# Patient Record
Sex: Male | Born: 1960 | Race: White | Hispanic: No | Smoking: Former smoker
Health system: Southern US, Community
[De-identification: ages and names within clinical notes are randomized; demographics above are authoritative.]

## PROBLEM LIST (undated history)

## (undated) DIAGNOSIS — K589 Irritable bowel syndrome without diarrhea: Secondary | ICD-10-CM

## (undated) DIAGNOSIS — E785 Hyperlipidemia, unspecified: Secondary | ICD-10-CM

## (undated) DIAGNOSIS — F329 Major depressive disorder, single episode, unspecified: Secondary | ICD-10-CM

## (undated) DIAGNOSIS — D126 Benign neoplasm of colon, unspecified: Secondary | ICD-10-CM

## (undated) DIAGNOSIS — F419 Anxiety disorder, unspecified: Secondary | ICD-10-CM

## (undated) DIAGNOSIS — F32A Depression, unspecified: Secondary | ICD-10-CM

## (undated) HISTORY — DX: Hyperlipidemia, unspecified: E78.5

## (undated) HISTORY — DX: Benign neoplasm of colon, unspecified: D12.6

## (undated) HISTORY — DX: Irritable bowel syndrome without diarrhea: K58.9

## (undated) HISTORY — DX: Anxiety disorder, unspecified: F41.9

---

## 2006-11-04 DIAGNOSIS — D126 Benign neoplasm of colon, unspecified: Secondary | ICD-10-CM

## 2006-11-04 HISTORY — DX: Benign neoplasm of colon, unspecified: D12.6

## 2007-03-02 ENCOUNTER — Ambulatory Visit: Payer: Self-pay | Admitting: Gastroenterology

## 2007-03-13 ENCOUNTER — Ambulatory Visit (HOSPITAL_COMMUNITY): Admission: RE | Admit: 2007-03-13 | Discharge: 2007-03-13 | Payer: Self-pay | Admitting: Gastroenterology

## 2007-03-13 ENCOUNTER — Encounter (INDEPENDENT_AMBULATORY_CARE_PROVIDER_SITE_OTHER): Payer: Self-pay | Admitting: Specialist

## 2007-03-13 ENCOUNTER — Ambulatory Visit: Payer: Self-pay | Admitting: Gastroenterology

## 2007-03-13 HISTORY — PX: ESOPHAGOGASTRODUODENOSCOPY: SHX1529

## 2007-03-13 HISTORY — PX: COLONOSCOPY: SHX174

## 2007-03-23 ENCOUNTER — Ambulatory Visit: Payer: Self-pay | Admitting: Gastroenterology

## 2007-07-13 ENCOUNTER — Ambulatory Visit: Payer: Self-pay | Admitting: Gastroenterology

## 2011-03-19 NOTE — Op Note (Signed)
Zachary Adams, Zachary Adams              ACCOUNT NO.:  [id number]   MEDICAL RECORD NO.:  [id number]          PATIENT TYPE:  AMB   LOCATION:  DAY                           FACILITY:  [facility]   PHYSICIAN:  Kassie Mends, M.D.      DATE OF BIRTH:  [birth year]   DATE OF PROCEDURE:  DATE OF DISCHARGE:                               OPERATIVE REPORT   REFERRING PHYSICIAN:  Donzetta Sprung, MD   PROCEDURES:  1. Colonoscopy with cold forceps biopsy.  2. Esophagogastroduodenoscopy with cold forceps biopsy.   INDICATION FOR EXAM:  [name] is a 50 year old male who presents with  rectal bleeding.  He also has intermittent normal stools with diarrhea.  His colonoscopy is being performed to evaluate his rectal bleeding as  well as his intermittent diarrhea.  He had negative endomysial  antibodies for celiac sprue and the esophagogastroduodenoscopy is being  performed to evaluate for celiac sprue.   FINDINGS:  1. A 5 mm sessile sigmoid colon polyp removed via cold forceps.      Otherwise no masses, inflammatory changes, diverticula,      arteriovenous malformations, or internal hemorrhoids seen.  2. Normal esophagus without evidence of erosion, Barrett's or ulcers.      Normal stomach, duodenal bulb and second portion of the duodenum.      Biopsies obtained to evaluate for celiac sprue.   RECOMMENDATIONS:  1. No source for Mr. Borgen intermittent rectal bleeding was      identified. Will call Mr. Shi with the results of his biopsies.  2. No aspirin, NSAIDs or anticoagulation for 7 days.  3. He should begin high-fiber diet in 7 days.  4. He was given information on a high-fiber diet as well as polyps.  5. Screening colonoscopy in 5 years if polyp is adenomatous and his      children and siblings will need a colonoscopy at age 1 then every      5 years.  6. He has a follow-up appointment to see me within the next 3-4 weeks.      If his biopsies are negative for celiac sprue or microscopic  colitis, then I will consider an empiric course of therapy for      internal hemorrhoids.   MEDICATIONS:  1. Demerol 75 mg IV.  2. Versed 7 mg IV.   PROCEDURE TECHNIQUE:  A physical exam was performed and informed consent  was obtained from the patient after explaining the benefits, risks and  alternatives to the procedure.  The patient was connected to monitor and  placed in the left lateral position.  Continuous oxygen was provided by  nasal cannula and IV medicine administered through an indwelling  cannula.  After administration of sedation and rectal exam, the  patient's rectum was intubated and the scope was advanced under direct  visualization to the cecum.  The scope was subsequently removed slowly  by carefully examining the color, texture, anatomy and integrity of the  mucosa on the way out.   After the colonoscopy, the patient's esophagus was intubated with a  diagnostic gastroscope and the scope was advanced  under direct  visualization to second portion of the duodenum.  The scope was  subsequently removed slowly by carefully examining the color, texture,  anatomy and integrity of the mucosa on the way out.  The patient was  recovered in endoscopy and discharged home in satisfactory condition.      Kassie Mends, M.D.  Electronically Signed     SM/MEDQ  D:  03/13/2007  T:  03/13/2007  Job:  [id number]   cc:   Donzetta Sprung  Fax: [phone]

## 2011-03-19 NOTE — Assessment & Plan Note (Signed)
NAME:  Zachary Adams, Zachary Adams               CHART#:  [id number]   DATE:  07/13/2007                       DOB:  August 27, 1961   REFERRING PHYSICIAN:  Donzetta Sprung, M.D.   PROBLEM LIST:  1. 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  2. Panic attacks.  3. Hyperlipidemia.   SUBJECTIVE:  Zachary Adams is a 50 year old male who presents as a return  patient visit.  He reports the diarrhea stopped on its own.  He is not  taking any Donnatal.  He read the instructions that said that he should  not work out in the sun when using the medication, and so he does not  even remember taking one Donnatal.  He is not having any problems with  abdominal cramps.  His appetite is good.  He does pass gas, but it is  not a problem.   MEDICATIONS:  1. Simvastatin 40 mg daily.  2. Cymbalta 90 mg every morning.  3. Xanax 0.5 mg one-half p.o. q.a.m.   OBJECTIVE:  VITAL SIGNS:  Weight 199 pounds (up 6 pounds since May of  2008), height 6 feet 1 inch.  Temperature 98.2, blood pressure 138/92,  pulse 88.  GENERAL:  He is in no apparent distress.  Alert and oriented x4. LUNGS:  Clear to auscultation bilaterally.CARDIOVASCULAR:  Regular rate and  rhythm with no murmur, normal S1 and S2.ABDOMEN:  Bowel sounds are  present.  Soft, nontender, nondistended.  No rebound or guarding.   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  His symptoms have now resolved.  He also had a history of an adenomatous polyp diagnosed at age 89.  Thank you for allowing me to see Zachary Adams in consultation.  My  recommendations follow.   RECOMMENDATIONS:  1. Screening colonoscopy in 5 years.  His children, brothers and      sisters should have a screening colonoscopy at age 57 and then      every 5 years.  I explained the reason behind this screening      interval and these recommendations to Zachary Adams.  2. He should continue a high  fiber diet.  3. He may return to see me as needed.       Kassie Mends, M.D.  Electronically Signed     SM/MEDQ  D:  07/13/2007  T:  07/13/2007  Job:  [id number]   cc:   Donzetta Sprung

## 2011-03-19 NOTE — Assessment & Plan Note (Signed)
NAME:  EDDI, HYMES               CHART#:  [id number]   DATE:  03/23/2007                       DOB:  Aug 21, 1961   REFERRING PHYSICIAN:  Donzetta Sprung   PROBLEM LIST:  1. 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  2. Panic attack.  3. Hyperlipidemia.   SUBJECTIVE:  The patient is a 50 year old male who was last seen in  April 2008.  He presents as a return patient visit.  He had a  colonoscopy, with cold forceps biopsies and an EGD with cold forceps  biopsies on May 9.  The biopsies of his duodenum and colon showed no  evidence of celiac sprue or microscopic colitis.  He did have an  adenomatous polyp.  He continues to report having a sensation of his  stomach rumbling and tumbling followed by popcorn shrimp diarrhea.  This happens 3-4 times a week plus.  Some days he has normal stool.  Initially he denied any rectal bleeding, however, he may occasionally  see a red streak around solid stool.  He also complains of having bad  gas.  His symptoms are not triggered by any certain foods.  He denies  any significant consumption of mild, cheese or ice cream.  He feels the  digestive advantage in Culturelle he has been trying since February has  made no significant difference.  He believes his symptoms were preceded  by an episode of an intestinal bug in August 2007.  He denies any  burping.  His wife wanted to make sure.  He told me had episodes of bad  breath on occasion.  He has not added any fiber to his diet.   MEDICATIONS:  1. Simvastatin.  2. Cymbalta.  3. Xanax.  4. Over-the-counter digestive Advantage.   OBJECTIVE EXAM:  VITAL SIGNS:  Weight 193 pounds (down 5 pounds since  April 2008).  Height 6' 1.  Temperature 98, blood pressure 130/86,  pulse 66.  GENERAL:  He is in no apparent distress.  Alert and oriented x 4.  LUNGS:  Clear to auscultation, bilaterally.  CARDIOVASCULAR:  Regular rhythm.  No murmur.  Normal S1, S2.  ABDOMEN:  Bowel sounds are present and soft, nontender and nondistended.  No rebound or guarding.   ASSESSMENT:  The patient is a 50 year old male with type 5 stool on the  [facility] stool chart.  He has no evidence of celiac sprue or microscopic  colitis.  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  He did have an adenomatous polyp.  Thank you for allowing me  to see this patient in consultation.  My recommendation follow.   RECOMMENDATIONS:  1. Screening colonoscopy in five years.  His children, brothers and      sisters should have a screening colonoscopy at age 85 and then      every 5 years.  2. He is given a prescription for hyoscyamine 0.375 mg, extended      release tablets.  Instructed to use half to one p.o. twice daily.      I did discuss the side effects, which include dry mouth, dry eyes,      drowsiness and urinary retention.  He is instructed to make sure he      drinks plenty of water  while taking the hyoscyamine .  He is also      instructed to have the prescription filled at [facility] because the      prescriptions will only cost $4 to be filled.  3. I recommended a high fiber diet.  He is given instruction on high      fiber diet and hand out is explained.  4. Return patient visit to see me in two months.  He did have a      question as to whether or not he could have a small bowel lesion.      I did offer him a capsule endoscopy or a small bowel follow      through.  He declined.  I did reassure him that this process most      likely does not involve his small intestines.       Kassie Mends, M.D.  Electronically Signed     SM/MEDQ  D:  03/23/2007  T:  03/23/2007  Job:  [id number]   cc:   Donzetta Sprung

## 2011-03-22 NOTE — Consult Note (Signed)
Zachary Adams, Zachary Adams              ACCOUNT NO.:  [id number]   MEDICAL RECORD NO.:  [id number]          PATIENT TYPE:  AMB   LOCATION:                                FACILITY:  [facility]   PHYSICIAN:  Kassie Mends, M.D.      DATE OF BIRTH:  09-19-1961   DATE OF CONSULTATION:  03/02/2007  DATE OF DISCHARGE:                                 CONSULTATION   REASON FOR CONSULTATION:  Diarrhea since August2007.   HISTORY OF PRESENT ILLNESS:  Zachary Adams is a 50 year old male who has had  diarrhea since August 2007.  His diarrhea is intermittent.  He states  that since summer, late fall he has intermittently had popcorn shrimp  diarrhea.  He had been maintained on imipramine since 1991 for his panic  attacks and he was changed to Cymbalta approximately 12 weeks ago.  He  used to have normal bowel movements.  But over the last 11 weeks he has  been having at least frequently watery diarrhea on the [facility] stool  chart he points out type 7 stool.  He is having episodes of watery  diarrhea at least an average of 4/7 days.  He will have weeks in which  he has 2/7 days and sometimes 0/7 days.  His average though is 4/7 days.  He sometimes has normal days, and sometimes he has no bowel movements.  Sometimes he has watery diarrhea.  He does not remember any  precipitating events.  He does have marital stress.  Occasionally he  sees streaks of blood in his stool.  This happens once a month.  He  denies any rectal pain or rectal itching.  He does not have any rashes.  He denies any sores in his mouth or difficulty swallowing.  He is not  having any heartburn, indigestion or weight loss.  He does drink a six-  pack a day.  He rarely has abdominal pain.  He was having some rectal  urgency with his loose stool.  He denies any drinking out of any streams  or ponds.  He reports having a thyroid checked and it was normal.  He  denies any travel.  He initially thought maybe his loose stool was  related to the  Cymbalta.  He did try some over-the-counter probiotics  and now is on Digestive Advantage but has not noticed any change.  He  had endomysial antibodies checked by Dr. Reuel Boom which were negative in  March 2008.  He has not really lost any weight.  He did submit stool  studies but stated when he submitted those stool studies he was having  formed stool.   PAST MEDICAL HISTORY:  1. Panic attacks.  2. Hyperlipidemia.   PAST SURGICAL HISTORY:  Fingernail surgery.   ALLERGIES:  Penicillin.   MEDICATIONS:  1. Simvastatin 40 mg daily.  2. Cymbalta 30 mg 3 in the morning.  3. Xanax 0.5 mg every 8 hours as needed.  4. Over-the-counter digestive advantage.   FAMILY HISTORY:  He denies family history of colon cancer.  He thinks  his mother may have had  polyps.  He has no family history of ulcer,  colitis or Crohn's disease.  He states his son may have had Crohn's  disease  or may be on the way to having Crohn's disease and is being  treated with MiraLax and Prozac.  He has no family history of breast  cancer, uterine cancer or ovarian cancer.   SOCIAL HISTORY:  He is married and has 2 children.  He is self employed  as a Therapist, occupational.  He does not smoke.   REVIEW OF SYSTEMS:  Per the HPI otherwise all systems are negative.   PHYSICAL EXAM:  VITALS:  Weight 198 pounds, height 6 feet 4 inches, BMI  24.1 (healthy) temperature 98.1, blood pressure 130/82, pulse 76.  GENERAL:  He is in no apparent distress, alert and x4.  HEENT:  Exam is  atraumatic, normocephalic.  Pupils equal, react to light.  Mouth no oral  lesions.  Posterior pharynx without erythema or exudate.  NECK:  Has  full range of motion, no lymphadenopathy.  LUNGS:  Clear to auscultation  bilaterally.  CARDIOVASCULAR:  Shows regular rhythm, no murmur, S1, S2.  ABDOMEN:  Bowel sounds present, soft, nontender, nondistended, no  rebound or guarding.  No hepatosplenomegaly, abdominal bruits or  pulsatile masses.  EXTREMITIES:   Without cyanosis, clubbing or edema.  NEURO:  No focal neurologic deficits.   LABORATORY DATA:  January2008 - BUN 12, creatinine 1.1, potassium 5.0,  albumin to 4.3, total bili 0.5, AST 21, ALT 17, hemoglobin 15.6,  platelets 155, cholesterol 223. Stool studies from February 2008 stool  culture negative for Salmonella, Shigella, Yersinia, Vibrio,  Campylobacter staph aureus or E. coli.  C. Diff toxin negative.   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  He also has rare, intermittent rectal bleeding.  The  differential diagnosis includes for his diarrhea functional gut  disorder, celiac sprue, microscopic colitis and a low likelihood of  villous adenoma.  The differential diagnosis for his rectal bleeding  includes villous adenoma and a garden variety tubular adenoma,  hemorrhoids and a low likelihood of colorectal carcinoma.  Thank you for  allowing me to see Zachary Adams in consultation.  My recommendations  follow.   RECOMMENDATIONS:  1. Will schedule colonoscopy to evaluate his rectal bleeding.  He will      be scheduled for an upper endoscopy to definitively rule out celiac      sprue.  2. Will make additional recommendations for his diarrhea after the      results of his biopsies are known.  3. Has a follow-up appointment to see me in one month.      Kassie Mends, M.D.  Electronically Signed     SM/MEDQ  D:  03/02/2007  T:  03/03/2007  Job:  [id number]   cc:   Donzetta Sprung  Fax: [phone]

## 2012-04-02 ENCOUNTER — Encounter: Payer: Self-pay | Admitting: Gastroenterology

## 2012-12-03 ENCOUNTER — Ambulatory Visit (INDEPENDENT_AMBULATORY_CARE_PROVIDER_SITE_OTHER): Payer: BC Managed Care – PPO | Admitting: Urgent Care

## 2012-12-03 ENCOUNTER — Encounter: Payer: Self-pay | Admitting: Urgent Care

## 2012-12-03 VITALS — BP 150/88 | HR 79 | Temp 98.0°F | Ht 76.0 in | Wt 192.0 lb

## 2012-12-03 DIAGNOSIS — Z86010 Personal history of colonic polyps: Secondary | ICD-10-CM

## 2012-12-03 NOTE — Patient Instructions (Addendum)
Colonoscopy with Dr Darrick Penna

## 2012-12-03 NOTE — Progress Notes (Signed)
Faxed to PCP

## 2012-12-03 NOTE — Assessment & Plan Note (Signed)
Due for surveillance colonoscopy with Dr Darrick Penna.  I have discussed risks & benefits which include, but are not limited to, bleeding, infection, perforation & drug reaction.  The patient agrees with this plan & written consent will be obtained.    Phenergan 25 mg IV 30 min prior to procedure to augment sedation given daily alcohol use

## 2012-12-03 NOTE — Progress Notes (Signed)
Primary Care Physician:  Donzetta Sprung, MD Primary Gastroenterologist:  Dr. Jonette Eva  Chief Complaint  Patient presents with  . Colonoscopy    HPI:  Zachary Adams is a 52 y.o. male here to set up surveillance colonoscopy.  Last colonoscopy by Dr Darrick Penna was in may 2008 where he had a single sigmoid adenomatous colon polyp removed.  He is doing well.   Denies constipation, diarrhea, rectal bleeding, melena or weight loss.   Denies heartburn, indigestion, nausea, vomiting, dysphagia, odynophagia or anorexia.  He does consume a 6-pack of beers daily.  He has done this for past 8 years at least.   Past Medical History  Diagnosis Date  . Anxiety   . IBS (irritable bowel syndrome)   . Adenomatous colon polyp 2008  . Hyperlipemia     Past Surgical History  Procedure Date  . Colonoscopy 03/13/2007      SLF: A 5 mm sessile sigmoid colon adenomatous polyp removed   . Esophagogastroduodenoscopy 03/13/2007      AVW:[id number] esophagus without evidence of erosion, Barrett's or ulcers/ Normal stomach, duodenal bulb and second portion of the duodenum    Current Outpatient Prescriptions  Medication Sig Dispense Refill  . ALPRAZolam (XANAX) 0.5 MG tablet Take 0.5 mg by mouth 3 (three) times daily as needed.       . CYMBALTA 30 MG capsule Take 90 mg by mouth daily.       . simvastatin (ZOCOR) 40 MG tablet Take 40 mg by mouth at bedtime.         Allergies as of 12/03/2012 - Review Complete 12/03/2012  Allergen Reaction Noted  . Penicillins Other (See Comments) 12/03/2012    Family History;There is no known family history of colorectal carcinoma , liver disease, or inflammatory bowel disease.  Problem Relation Age of Onset  . Parkinsonism Father   . Osteoporosis Mother     History   Social History  . Marital Status: Married    Spouse Name: N/A    Number of Children: 2  . Years of Education: N/A   Occupational History  . self employed, horse shoes    Social History Main Topics   . Smoking status: Current Every Day Smoker -- 30 years    Types: Cigars  . Smokeless tobacco: Not on file     Comment: quit cigarettes 2000 (15 yrs), now only 1 cigar per day  . Alcohol Use: Yes     Comment: about a 6 pack each night of beer for 8 yrs  . Drug Use: No  . Sexually Active: Not on file   Other Topics Concern  . Not on file   Social History Narrative   Lives w/ youngest son.  Divorced.    Review of Systems: Gen: Denies any fever, chills, sweats, anorexia, fatigue, weakness, malaise, weight loss, and sleep disorder CV: Denies chest pain, angina, palpitations, syncope, orthopnea, PND, peripheral edema, and claudication. Resp: Denies dyspnea at rest, dyspnea with exercise, cough, sputum, wheezing, coughing up blood, and pleurisy. GI: Denies vomiting blood, jaundice, and fecal incontinence.   Denies dysphagia or odynophagia. GU : Denies urinary burning, blood in urine, urinary frequency, urinary hesitancy, nocturnal urination, and urinary incontinence. MS: Denies joint pain, limitation of movement, and swelling, stiffness, low back pain, extremity pain. Denies muscle weakness, cramps, atrophy.  Derm: Denies rash, itching, dry skin, hives, moles, warts, or unhealing ulcers.  Psych: Denies depression, anxiety, memory loss, suicidal ideation, hallucinations, paranoia, and confusion. Heme: Denies bruising, bleeding, and  enlarged lymph nodes. Neuro:  Denies any headaches, dizziness, paresthesias. Endo:  Denies any problems with DM, thyroid, adrenal function.  Physical Exam: BP 150/88  Pulse 79  Temp 98 F (36.7 C) (Oral)  Ht 6\' 4"  (1.93 m)  Wt 192 lb (87.091 kg)  BMI 23.37 kg/m2 No LMP for male patient. General:   Alert,  Well-developed, well-nourished, pleasant and cooperative in NAD Head:  Normocephalic and atraumatic. Eyes:  Sclera clear, no icterus.   Conjunctiva pink. Ears:  Normal auditory acuity. Nose:  No deformity, discharge, or lesions. Mouth:  No deformity  or lesions,oropharynx pink & moist. Neck:  Supple; no masses or thyromegaly. Lungs:  Clear throughout to auscultation.   No wheezes, crackles, or rhonchi. No acute distress. Heart:  Regular rate and rhythm; no murmurs, clicks, rubs,  or gallops. Abdomen:  Normal bowel sounds.  No bruits.  Soft, non-tender and non-distended without masses, hepatosplenomegaly or hernias noted.  No guarding or rebound tenderness.   Rectal:  Deferred. Msk:  Symmetrical without gross deformities. Normal posture. Pulses:  Normal pulses noted. Extremities:  No clubbing or edema. Neurologic:  Alert and  oriented x4;  grossly normal neurologically. Skin:  Intact without significant lesions or rashes. Lymph Nodes:  No significant cervical adenopathy. Psych:  Alert and cooperative. Normal mood and affect.

## 2012-12-04 ENCOUNTER — Encounter (HOSPITAL_COMMUNITY): Payer: Self-pay | Admitting: Pharmacy Technician

## 2012-12-15 ENCOUNTER — Encounter (HOSPITAL_COMMUNITY)
Admission: RE | Disposition: A | Discharge status: Home / Self Care | Payer: Self-pay | Source: Ambulatory Visit | Attending: Gastroenterology

## 2012-12-15 ENCOUNTER — Ambulatory Visit (HOSPITAL_COMMUNITY)
Admission: RE | Admit: 2012-12-15 | Discharge: 2012-12-15 | Disposition: A | Discharge status: Home / Self Care | Payer: BC Managed Care – PPO | Source: Ambulatory Visit | Attending: Gastroenterology | Admitting: Gastroenterology

## 2012-12-15 ENCOUNTER — Encounter (HOSPITAL_COMMUNITY): Payer: Self-pay | Admitting: *Deleted

## 2012-12-15 DIAGNOSIS — Z860100 Personal history of colon polyps, unspecified: Secondary | ICD-10-CM | POA: Insufficient documentation

## 2012-12-15 DIAGNOSIS — D126 Benign neoplasm of colon, unspecified: Secondary | ICD-10-CM | POA: Insufficient documentation

## 2012-12-15 DIAGNOSIS — K5730 Diverticulosis of large intestine without perforation or abscess without bleeding: Secondary | ICD-10-CM

## 2012-12-15 DIAGNOSIS — Z1211 Encounter for screening for malignant neoplasm of colon: Secondary | ICD-10-CM

## 2012-12-15 DIAGNOSIS — K648 Other hemorrhoids: Secondary | ICD-10-CM | POA: Insufficient documentation

## 2012-12-15 HISTORY — PX: COLONOSCOPY: SHX5424

## 2012-12-15 SURGERY — COLONOSCOPY
Anesthesia: Moderate Sedation

## 2012-12-15 MED ORDER — MIDAZOLAM HCL 5 MG/5ML IJ SOLN
INTRAMUSCULAR | Status: DC | PRN
  Administered 2012-12-15: 2 mg via INTRAVENOUS
  Administered 2012-12-15: 1 mg via INTRAVENOUS
  Administered 2012-12-15 (×2): 2 mg via INTRAVENOUS

## 2012-12-15 MED ORDER — MIDAZOLAM HCL 5 MG/5ML IJ SOLN
INTRAMUSCULAR | Status: AC
  Filled 2012-12-15: qty 10

## 2012-12-15 MED ORDER — PROMETHAZINE HCL 25 MG/ML IJ SOLN
INTRAMUSCULAR | Status: AC
  Filled 2012-12-15: qty 1

## 2012-12-15 MED ORDER — STERILE WATER FOR IRRIGATION IR SOLN
Status: DC | PRN
  Administered 2012-12-15: 11:00:00

## 2012-12-15 MED ORDER — SODIUM CHLORIDE 0.45 % IV SOLN
INTRAVENOUS | Status: DC
  Administered 2012-12-15: 10:00:00 via INTRAVENOUS

## 2012-12-15 MED ORDER — MEPERIDINE HCL 100 MG/ML IJ SOLN
INTRAMUSCULAR | Status: AC
  Filled 2012-12-15: qty 1

## 2012-12-15 MED ORDER — PROMETHAZINE HCL 25 MG/ML IJ SOLN
25.0000 mg | Freq: Once | INTRAMUSCULAR | Status: AC
  Administered 2012-12-15: 25 mg via INTRAVENOUS

## 2012-12-15 MED ORDER — SODIUM CHLORIDE 0.9 % IJ SOLN
INTRAMUSCULAR | Status: AC
  Filled 2012-12-15: qty 10

## 2012-12-15 MED ORDER — MEPERIDINE HCL 100 MG/ML IJ SOLN
INTRAMUSCULAR | Status: DC | PRN
  Administered 2012-12-15 (×3): 25 mg via INTRAVENOUS

## 2012-12-15 NOTE — Op Note (Signed)
Zachary Adams [address] [place], [address]   COLONOSCOPY PROCEDURE REPORT  PATIENT: Zachary Adams, Zachary Adams  MR#: [id number] BIRTHDATE: 1961/08/12 , 51  yrs. old GENDER: Male ENDOSCOPIST: Jonette Eva, MD REFERRED DG:UYQIH Reuel Boom, M.D. PROCEDURE DATE:  12/15/2012 PROCEDURE:   Colonoscopy with biopsy and snare polypectomy INDICATIONS:High risk patient with personal history of colonic polyps. MEDICATIONS: Demerol 75 mg IV and Versed 7 mg IV  PREOP: 25 MG IV  DESCRIPTION OF PROCEDURE:    Physical exam was performed.  Informed consent was obtained from the patient after explaining the benefits, risks, and alternatives to procedure.  The patient was connected to monitor and placed in left lateral position. Continuous oxygen was provided by nasal cannula and IV medicine administered through an indwelling cannula.  After administration of sedation and rectal exam, the patients rectum was intubated and the Pentax Colonoscope [id number]  colonoscope was advanced under direct visualization to the cecum.  The scope was removed slowly by carefully examining the color, texture, anatomy, and integrity mucosa on the way out.  The patient was recovered in endoscopy and discharged home in satisfactory condition.    COLON FINDINGS: Multiple sessile polyps ranging between 3-55mm in size were found in the transverse colon(Butters), descending colon, and sigmoid colon.  A polypectomy was performed using snare cautery and with cold forceps.  , Mild diverticulosis was noted in the sigmoid colon.  , and Moderate sized internal hemorrhoids were found.  PREP QUALITY: good. CECAL W/D TIME: 25 minutes  COMPLICATIONS: None  ENDOSCOPIC IMPRESSION: 1.   12  COLON polyps REMOVED 2.   Mild diverticulosis in the sigmoid colon 3.   Moderate sized internal hemorrhoids  RECOMMENDATIONS: AWAIT BIOPSY HIGH FIBER DIET TCS IN 5 YEARS IF SIMPLE ADENOMAS REMOVED.  TCS IN 10 YEARS IF HYPERPLASTIC POLYPS  REMOVED.       _______________________________ Rosalie DoctorJonette Eva, MD 12/15/2012 12:04 PM     PATIENT NAME:  Zachary, Adams MR#: [id number]

## 2012-12-15 NOTE — H&P (Signed)
  Primary Care Physician:  Donzetta Sprung, MD Primary Gastroenterologist:  Dr. Darrick Penna  Pre-Procedure History & Physical: HPI:  Zachary Adams is a 52 y.o. male here for  PERSONAL HISTORY OF POLYPS.  Past Medical History  Diagnosis Date  . Anxiety   . IBS (irritable bowel syndrome)   . Adenomatous colon polyp 2008  . Hyperlipemia     Past Surgical History  Procedure Laterality Date  . Colonoscopy  03/13/2007      SLF: A 5 mm sessile sigmoid adenomatous polyp removed   . Esophagogastroduodenoscopy  03/13/2007      [id number] esophagus without evidence of erosion, Barrett's or ulcers/ Normal stomach, duodenal bulb and second portion of the duodenum    Prior to Admission medications   Medication Sig Start Date End Date Taking? Authorizing Provider  ALPRAZolam Prudy Feeler) 0.5 MG tablet Take 0.5 mg by mouth 3 (three) times daily as needed. Anxiety. 11/09/12  Yes Historical Provider, MD  CYMBALTA 30 MG capsule Take 90 mg by mouth daily.  09/09/12  Yes Historical Provider, MD  simvastatin (ZOCOR) 40 MG tablet Take 40 mg by mouth at bedtime.  11/19/12  Yes Historical Provider, MD    Allergies as of 12/03/2012 - Review Complete 12/03/2012  Allergen Reaction Noted  . Penicillins Other (See Comments) 12/03/2012    Family History  Problem Relation Age of Onset  . Parkinsonism Father   . Osteoporosis Mother   . Colon cancer Neg Hx     History   Social History  . Marital Status: Divorced    Spouse Name: N/A    Number of Children: 2  . Years of Education: N/A   Occupational History  . self employed, horse shoes    Social History Main Topics  . Smoking status: Current Every Day Smoker -- 2.00 packs/day for 30 years    Types: Cigars  . Smokeless tobacco: Not on file     Comment: quit cigarettes 2000 (15 yrs), now only 1 cigar per day  . Alcohol Use: Yes     Comment: about a 6 pack each night of beer for 8 yrs  . Drug Use: No  . Sexually Active: Not on file   Other Topics Concern   . Not on file   Social History Narrative   Lives w/ youngest son.  Divorced.    Review of Systems: See HPI, otherwise negative ROS   Physical Exam: BP 130/92  Temp(Src) 98.3 F (36.8 C) (Oral)  Resp 18  Ht 6\' 4"  (1.93 m)  Wt 192 lb (87.091 kg)  BMI 23.38 kg/m2  SpO2 95% General:   Alert,  pleasant and cooperative in NAD Head:  Normocephalic and atraumatic. Neck:  Supple; Lungs:  Clear throughout to auscultation.    Heart:  Regular rate and rhythm. Abdomen:  Soft, nontender and nondistended. Normal bowel sounds, without guarding, and without rebound.   Neurologic:  Alert and  oriented x4;  grossly normal neurologically.  Impression/Plan:    PERSONAL HISTORY OF POLYPS.  PLAN: 1. TCS

## 2012-12-18 ENCOUNTER — Encounter (HOSPITAL_COMMUNITY): Payer: Self-pay | Admitting: Gastroenterology

## 2012-12-22 ENCOUNTER — Telehealth: Payer: Self-pay | Admitting: Gastroenterology

## 2012-12-22 NOTE — Telephone Encounter (Signed)
Path faxed to PCP, recall made 

## 2012-12-22 NOTE — Telephone Encounter (Signed)
Please call pt. He had A SERRATED & HYPERPLASTIC POLYPS REMOVED Removed from hIS colon.   FOLLOW A HIGH FIBER DIET. AVOID ITEMS THAT CAUSE BLOATING.   Next colonoscopy in 5 years.

## 2012-12-31 ENCOUNTER — Telehealth: Payer: Self-pay | Admitting: Gastroenterology

## 2012-12-31 NOTE — Telephone Encounter (Signed)
Pt had called for results. I returned his call and LMOM to call.

## 2012-12-31 NOTE — Telephone Encounter (Signed)
LMOM for pt to call. See results note of 12/22/2012.

## 2012-12-31 NOTE — Telephone Encounter (Signed)
Pt had tcs on 2/11 and hasn't heard back regarding his results. Please call him at [phone]

## 2012-12-31 NOTE — Telephone Encounter (Signed)
Pt returning DS call. DS not available. Please call pt back [phone]

## 2012-12-31 NOTE — Telephone Encounter (Signed)
Returned call and LMOM to call.

## 2013-01-01 NOTE — Telephone Encounter (Signed)
Called and informed pt of results and next colonoscopy in 5 years. ( See results of 12/22/2012 note.

## 2013-04-06 NOTE — Progress Notes (Signed)
FEB 2014 SERRATED & HYPERPLASTIC POLYPS-NEXT TCS 2019  REVIEWED.

## 2016-10-07 DIAGNOSIS — Z0001 Encounter for general adult medical examination with abnormal findings: Secondary | ICD-10-CM | POA: Diagnosis not present

## 2017-04-14 DIAGNOSIS — Z0001 Encounter for general adult medical examination with abnormal findings: Secondary | ICD-10-CM | POA: Diagnosis not present

## 2017-04-21 DIAGNOSIS — Z6824 Body mass index (BMI) 24.0-24.9, adult: Secondary | ICD-10-CM | POA: Diagnosis not present

## 2017-04-21 DIAGNOSIS — Z0001 Encounter for general adult medical examination with abnormal findings: Secondary | ICD-10-CM | POA: Diagnosis not present

## 2017-04-21 DIAGNOSIS — Z1212 Encounter for screening for malignant neoplasm of rectum: Secondary | ICD-10-CM | POA: Diagnosis not present

## 2017-11-06 ENCOUNTER — Encounter: Payer: Self-pay | Admitting: Gastroenterology

## 2017-11-10 DIAGNOSIS — F17210 Nicotine dependence, cigarettes, uncomplicated: Secondary | ICD-10-CM | POA: Diagnosis not present

## 2017-11-10 DIAGNOSIS — Z9189 Other specified personal risk factors, not elsewhere classified: Secondary | ICD-10-CM | POA: Diagnosis not present

## 2017-11-10 DIAGNOSIS — Z720 Tobacco use: Secondary | ICD-10-CM | POA: Diagnosis not present

## 2017-11-10 DIAGNOSIS — E782 Mixed hyperlipidemia: Secondary | ICD-10-CM | POA: Diagnosis not present

## 2017-11-17 DIAGNOSIS — F410 Panic disorder [episodic paroxysmal anxiety] without agoraphobia: Secondary | ICD-10-CM | POA: Diagnosis not present

## 2017-11-17 DIAGNOSIS — E782 Mixed hyperlipidemia: Secondary | ICD-10-CM | POA: Diagnosis not present

## 2017-11-17 DIAGNOSIS — F1010 Alcohol abuse, uncomplicated: Secondary | ICD-10-CM | POA: Diagnosis not present

## 2017-11-17 DIAGNOSIS — Z6826 Body mass index (BMI) 26.0-26.9, adult: Secondary | ICD-10-CM | POA: Diagnosis not present

## 2017-11-27 ENCOUNTER — Ambulatory Visit (INDEPENDENT_AMBULATORY_CARE_PROVIDER_SITE_OTHER): Payer: Self-pay

## 2017-11-27 DIAGNOSIS — Z86010 Personal history of colonic polyps: Secondary | ICD-10-CM

## 2017-11-27 NOTE — Progress Notes (Signed)
Gastroenterology Pre-Procedure Review  Request Date:11/27/17 Requesting Physician: 5 year recall slf  PATIENT REVIEW QUESTIONS: The patient responded to the following health history questions as indicated:    1. Diabetes Melitis: no 2. Joint replacements in the past 12 months: no 3. Major health problems in the past 3 months: no 4. Has an artificial valve or MVP: no 5. Has a defibrillator: no 6. Has been advised in past to take antibiotics in advance of a procedure like teeth cleaning: no 7. Family history of colon cancer: yes (yes great uncle)  71. Alcohol Use: yes (daily- beer- 12 ) 9. History of sleep apnea: no  10. History of coronary artery or other vascular stents placed within the last 12 months: no 11. History of any prior anesthesia complications: no    MEDICATIONS & ALLERGIES:    Patient reports the following regarding taking any blood thinners:   Plavix? no Aspirin? no Coumadin? no Brilinta? no Xarelto? no Eliquis? no Pradaxa? no Savaysa? no Effient? no  Patient confirms/reports the following medications:  Current Outpatient Medications  Medication Sig Dispense Refill  . ALPRAZolam (XANAX) 0.5 MG tablet Take 0.5 mg by mouth 3 (three) times daily as needed. Anxiety.    . Ascorbic Acid (VITAMIN C) 1000 MG tablet Take 1,000 mg by mouth daily.    . CYMBALTA 30 MG capsule Take 90 mg by mouth daily.     Marland Kitchen loratadine (CLARITIN) 10 MG tablet Take 10 mg by mouth daily.    . Multiple Vitamin (MULTIVITAMIN) tablet Take 1 tablet by mouth daily.    . simvastatin (ZOCOR) 40 MG tablet Take 40 mg by mouth at bedtime.     . triamcinolone cream (KENALOG) 0.1 % APPLY TO AFFECTED AREA 3 TIMES A DAY  1   No current facility-administered medications for this visit.     Patient confirms/reports the following allergies:  Allergies  Allergen Reactions  . Penicillins Other (See Comments)    Does not know    No orders of the defined types were placed in this  encounter.   AUTHORIZATION INFORMATION Primary Insurance:   ID #:   Group #:  Pre-Cert / Auth required:  Pre-Cert / Auth #:    SCHEDULE INFORMATION: Procedure has been scheduled as follows:  Date: , Time: Location:   This Gastroenterology Pre-Precedure Review Form is being routed to the following provider(s): Walden Field NP

## 2017-11-27 NOTE — Progress Notes (Signed)
Patient will need OV for augmented sedation due to 12 beers daily and sedating medications.

## 2017-11-27 NOTE — Progress Notes (Signed)
Pt came in for nurse visit. He is currently taking xanax tid and cymbalta 90mg  a day and drinking 12 beers a day. Spoke with EG. Advised pt he would need to be done in the OR and he would need to come in for an ov. He stated he understood. He is scheduled with AB on 12/10/17.

## 2017-12-10 ENCOUNTER — Encounter: Payer: Self-pay | Admitting: Gastroenterology

## 2017-12-10 ENCOUNTER — Other Ambulatory Visit: Payer: Self-pay

## 2017-12-10 ENCOUNTER — Ambulatory Visit: Payer: BLUE CROSS/BLUE SHIELD | Admitting: Gastroenterology

## 2017-12-10 VITALS — BP 147/93 | HR 105 | Temp 99.2°F | Ht 76.0 in | Wt 210.4 lb

## 2017-12-10 DIAGNOSIS — Z86010 Personal history of colonic polyps: Secondary | ICD-10-CM

## 2017-12-10 MED ORDER — PEG 3350-KCL-NA BICARB-NACL 420 G PO SOLR: 4000.0000 mL | ORAL | 0 refills | Status: DC

## 2017-12-10 NOTE — Progress Notes (Signed)
CC'D TO PCP °

## 2017-12-10 NOTE — Progress Notes (Addendum)
REVIEWED-NO ADDITIONAL RECOMMENDATIONS.  Primary Care Physician:  Caryl Bis, MD Primary Gastroenterologist:  Dr. Oneida Alar   Chief Complaint  Patient presents with  . Colonoscopy    consult    HPI:   Zachary Adams is a 57 y.o. male presenting today to schedule surveillance colonoscopy due to history of adenomatous polyps. Last was in 2014 with multiple sessile polyps. He denies any abdominal pain, N/V, rectal bleeding, changes in bowel habits. He drinks about 12 beers each evening. He states his PCP does blood work and his labs are normal. Denies abdominal distension, lower extremity edema, chest pain, shortness of breath, muscle aches and pains. He states he has chronic anxiety.   Past Medical History:  Diagnosis Date  . Adenomatous colon polyp 2008  . Anxiety   . Hyperlipemia   . IBS (irritable bowel syndrome)     Past Surgical History:  Procedure Laterality Date  . COLONOSCOPY  03/13/2007     SLF: A 5 mm sessile sigmoid adenomatous polyp removed   . COLONOSCOPY N/A 12/15/2012   Dr. Oneida Alar: multiple sessile polyps (12), mild diverticulosis in sigmoid colon, moderate internal hemorrhoids. path with serrated adenoma and hyperplastic polyps.   . ESOPHAGOGASTRODUODENOSCOPY  03/13/2007     [id number] esophagus without evidence of erosion, Barrett's or ulcers/ Normal stomach, duodenal bulb and second portion of the duodenum    Current Outpatient Medications  Medication Sig Dispense Refill  . ALPRAZolam (XANAX) 0.5 MG tablet Take 0.5 mg by mouth 3 (three) times daily as needed. Anxiety.    . Ascorbic Acid (VITAMIN C) 1000 MG tablet Take 1,000 mg by mouth daily.    . CYMBALTA 30 MG capsule Take 30 mg by mouth daily.     Marland Kitchen loratadine (CLARITIN) 10 MG tablet Take 10 mg by mouth daily.    . Multiple Vitamin (MULTIVITAMIN) tablet Take 1 tablet by mouth daily.    . simvastatin (ZOCOR) 40 MG tablet Take 40 mg by mouth at bedtime.     . triamcinolone cream (KENALOG) 0.1 % APPLY  TO AFFECTED AREA 3 TIMES A DAY  1   No current facility-administered medications for this visit.     Allergies as of 12/10/2017 - Review Complete 12/10/2017  Allergen Reaction Noted  . Penicillins Other (See Comments) 12/03/2012    Family History  Problem Relation Age of Onset  . Parkinsonism Father   . Osteoporosis Mother   . Colon cancer Neg Hx     Social History   Socioeconomic History  . Marital status: Divorced    Spouse name: Not on file  . Number of children: 2  . Years of education: Not on file  . Highest education level: Not on file  Social Needs  . Financial resource strain: Not on file  . Food insecurity - worry: Not on file  . Food insecurity - inability: Not on file  . Transportation needs - medical: Not on file  . Transportation needs - non-medical: Not on file  Occupational History  . Occupation: self employed, horse shoes  Tobacco Use  . Smoking status: Former Smoker    Packs/day: 2.00    Years: 30.00    Pack years: 60.00    Types: Cigars    Last attempt to quit: 03/19/2017    Years since quitting: 0.7  . Smokeless tobacco: Never Used  . Tobacco comment: quit cigarettes 2000, quit cigar May 2018   Substance and Sexual Activity  . Alcohol use: Yes  Comment: about a 12 pack each night of beer chronically   . Drug use: No  . Sexual activity: Not on file  Other Topics Concern  . Not on file  Social History Narrative   Lives w/ youngest son.  Divorced.    Review of Systems: Gen: Denies any fever, chills, fatigue, weight loss, lack of appetite.  CV: Denies chest pain, heart palpitations, peripheral edema, syncope.  Resp: Denies shortness of breath at rest or with exertion. Denies wheezing or cough.  GI: Denies dysphagia or odynophagia. Denies jaundice, hematemesis, fecal incontinence. GU : Denies urinary burning, urinary frequency, urinary hesitancy MS: Denies joint pain, muscle weakness, cramps, or limitation of movement.  Derm: Denies rash,  itching, dry skin Psych: Denies depression, anxiety, memory loss, and confusion Heme: Denies bruising, bleeding, and enlarged lymph nodes.  Physical Exam: BP (!) 147/93   Pulse (!) 105   Temp 99.2 F (37.3 C) (Oral)   Ht 6\' 4"  (1.93 m)   Wt 210 lb 6.4 oz (95.4 kg)   BMI 25.61 kg/m  General:   Alert and oriented. Pleasant and cooperative. Well-nourished and well-developed.  Head:  Normocephalic and atraumatic. Eyes:  Without icterus, sclera clear and conjunctiva pink.  Ears:  Normal auditory acuity. Nose:  No deformity, discharge,  or lesions. Mouth:  No deformity or lesions, oral mucosa pink.  Lungs:  Clear to auscultation bilaterally. No wheezes, rales, or rhonchi. No distress.  Heart:  S1, S2 present without murmurs appreciated.  Abdomen:  +BS, soft, non-tender and non-distended. No HSM noted. No guarding or rebound. No masses appreciated.  Rectal:  Deferred  Msk:  Symmetrical without gross deformities. Normal posture. Extremities:  Without  edema. Neurologic:  Alert and  oriented x4;  grossly normal neurologically. Skin:  Intact without significant lesions or rashes. Cervical Nodes:  No significant cervical adenopathy. Psych:  Alert and cooperative. Normal mood and affect.

## 2017-12-10 NOTE — Patient Instructions (Signed)
We have scheduled you for a colonoscopy with Dr. Oneida Alar in the near future.  It was good to meet you!

## 2017-12-10 NOTE — Assessment & Plan Note (Signed)
56 year old male with history of adenomatous polyps (multiple polyps removed in 2014), with need for surveillance colonoscopy. No concerning lower or upper GI signs/symptoms. Due to polypharmacy and ETOH abuse, will need Propofol. As of note, he drinks 12 beers a day. We had an extensive discussion regarding alcohol use and liver disease. He states he drinks because it helps his anxiety. I encouraged him to speak with Dr. Quillian Quince regarding possible programs for him, as he desires to quit.   Proceed with colonoscopy with Dr. Oneida Alar in the near future. The risks, benefits, and alternatives have been discussed in detail with the patient. They state understanding and desire to proceed.  Propofol due to polypharmacy and ETOH abuse

## 2017-12-11 ENCOUNTER — Telehealth: Payer: Self-pay

## 2017-12-11 NOTE — Telephone Encounter (Signed)
Tried to call pt to inform of pre-op appt 01/06/18 at 9:00am, no answer, LMOAM. Letter mailed.

## 2018-01-02 NOTE — Patient Instructions (Signed)
Zachary Adams  01/02/2018     @PREFPERIOPPHARMACY @   Your procedure is scheduled on 01/13/2018  Report to [facility] at 615 A.M.  Call this number if you have problems the morning of surgery:  [phone]   Remember:  Do not eat food or drink liquids after midnight.  Take these medicines the morning of surgery with A SIP OF WATER Xanax, Cymbalta, Claritin   Do not wear jewelry, make-up or nail polish.  Do not wear lotions, powders, or perfumes, or deodorant.  Do not shave 48 hours prior to surgery.  Men may shave face and neck.  Do not bring valuables to the hospital.  [facility] is not responsible for any belongings or valuables.  Contacts, dentures or bridgework may not be worn into surgery.  Leave your suitcase in the car.  After surgery it may be brought to your room.  For patients admitted to the hospital, discharge time will be determined by your treatment team.  Patients discharged the day of surgery will not be allowed to drive home.    Please read over the following fact sheets that you were given. Anesthesia Post-op Instructions     PATIENT INSTRUCTIONS POST-ANESTHESIA  IMMEDIATELY FOLLOWING SURGERY:  Do not drive or operate machinery for the first twenty four hours after surgery.  Do not make any important decisions for twenty four hours after surgery or while taking narcotic pain medications or sedatives.  If you develop intractable nausea and vomiting or a severe headache please notify your doctor immediately.  FOLLOW-UP:  Please make an appointment with your surgeon as instructed. You do not need to follow up with anesthesia unless specifically instructed to do so.  WOUND CARE INSTRUCTIONS (if applicable):  Keep a dry clean dressing on the anesthesia/puncture wound site if there is drainage.  Once the wound has quit draining you may leave it open to air.  Generally you should leave the bandage intact for twenty four hours unless there is drainage.  If the  epidural site drains for more than 36-48 hours please call the anesthesia department.  QUESTIONS?:  Please feel free to call your physician or the hospital operator if you have any questions, and they will be happy to assist you.      Colonoscopy, Adult A colonoscopy is an exam to look at the entire large intestine. During the exam, a lubricated, bendable tube is inserted into the anus and then passed into the rectum, colon, and other parts of the large intestine. A colonoscopy is often done as a part of normal colorectal screening or in response to certain symptoms, such as anemia, persistent diarrhea, abdominal pain, and blood in the stool. The exam can help screen for and diagnose medical problems, including:  Tumors.  Polyps.  Inflammation.  Areas of bleeding.  Tell a health care provider about:  Any allergies you have.  All medicines you are taking, including vitamins, herbs, eye drops, creams, and over-the-counter medicines.  Any problems you or family members have had with anesthetic medicines.  Any blood disorders you have.  Any surgeries you have had.  Any medical conditions you have.  Any problems you have had passing stool. What are the risks? Generally, this is a safe procedure. However, problems may occur, including:  Bleeding.  A tear in the intestine.  A reaction to medicines given during the exam.  Infection (rare).  What happens before the procedure? Eating and drinking restrictions Follow instructions from your health care provider  about eating and drinking, which may include:  A few days before the procedure - follow a low-fiber diet. Avoid nuts, seeds, dried fruit, raw fruits, and vegetables.  1-3 days before the procedure - follow a clear liquid diet. Drink only clear liquids, such as clear broth or bouillon, black coffee or tea, clear juice, clear soft drinks or sports drinks, gelatin dessert, and popsicles. Avoid any liquids that contain red or  purple dye.  On the day of the procedure - do not eat or drink anything during the 2 hours before the procedure, or within the time period that your health care provider recommends.  Bowel prep If you were prescribed an oral bowel prep to clean out your colon:  Take it as told by your health care provider. Starting the day before your procedure, you will need to drink a large amount of medicated liquid. The liquid will cause you to have multiple loose stools until your stool is almost clear or light green.  If your skin or anus gets irritated from diarrhea, you may use these to relieve the irritation: ? Medicated wipes, such as adult wet wipes with aloe and vitamin E. ? A skin soothing-product like petroleum jelly.  If you vomit while drinking the bowel prep, take a break for up to 60 minutes and then begin the bowel prep again. If vomiting continues and you cannot take the bowel prep without vomiting, call your health care provider.  General instructions  Ask your health care provider about changing or stopping your regular medicines. This is especially important if you are taking diabetes medicines or blood thinners.  Plan to have someone take you home from the hospital or clinic. What happens during the procedure?  An IV tube may be inserted into one of your veins.  You will be given medicine to help you relax (sedative).  To reduce your risk of infection: ? Your health care team will wash or sanitize their hands. ? Your anal area will be washed with soap.  You will be asked to lie on your side with your knees bent.  Your health care provider will lubricate a long, thin, flexible tube. The tube will have a camera and a light on the end.  The tube will be inserted into your anus.  The tube will be gently eased through your rectum and colon.  Air will be delivered into your colon to keep it open. You may feel some pressure or cramping.  The camera will be used to take images  during the procedure.  A small tissue sample may be removed from your body to be examined under a microscope (biopsy). If any potential problems are found, the tissue will be sent to a lab for testing.  If small polyps are found, your health care provider may remove them and have them checked for cancer cells.  The tube that was inserted into your anus will be slowly removed. The procedure may vary among health care providers and hospitals. What happens after the procedure?  Your blood pressure, heart rate, breathing rate, and blood oxygen level will be monitored until the medicines you were given have worn off.  Do not drive for 24 hours after the exam.  You may have a small amount of blood in your stool.  You may pass gas and have mild abdominal cramping or bloating due to the air that was used to inflate your colon during the exam.  It is up to you to get the  results of your procedure. Ask your health care provider, or the department performing the procedure, when your results will be ready. This information is not intended to replace advice given to you by your health care provider. Make sure you discuss any questions you have with your health care provider. Document Released: 10/18/2000 Document Revised: 08/21/2016 Document Reviewed: 01/02/2016 Elsevier Interactive Patient Education  2018 [employer].

## 2018-01-06 ENCOUNTER — Encounter (HOSPITAL_COMMUNITY): Payer: Self-pay

## 2018-01-06 ENCOUNTER — Other Ambulatory Visit: Payer: Self-pay

## 2018-01-06 ENCOUNTER — Encounter (HOSPITAL_COMMUNITY)
Admission: RE | Admit: 2018-01-06 | Discharge: 2018-01-06 | Disposition: A | Discharge status: Home / Self Care | Payer: BLUE CROSS/BLUE SHIELD | Source: Ambulatory Visit | Attending: Gastroenterology | Admitting: Gastroenterology

## 2018-01-06 DIAGNOSIS — Z01812 Encounter for preprocedural laboratory examination: Secondary | ICD-10-CM | POA: Diagnosis not present

## 2018-01-06 DIAGNOSIS — Z01810 Encounter for preprocedural cardiovascular examination: Secondary | ICD-10-CM | POA: Insufficient documentation

## 2018-01-06 HISTORY — DX: Major depressive disorder, single episode, unspecified: F32.9

## 2018-01-06 HISTORY — DX: Depression, unspecified: F32.A

## 2018-01-06 LAB — BASIC METABOLIC PANEL
Anion gap: 11 (ref 5–15)
BUN: 10 mg/dL (ref 6–20)
CO2: 21 mmol/L — ABNORMAL LOW (ref 22–32)
Calcium: 9.2 mg/dL (ref 8.9–10.3)
Chloride: 103 mmol/L (ref 101–111)
Creatinine, Ser: 0.8 mg/dL (ref 0.61–1.24)
GFR calc Af Amer: >60 mL/min (ref >60)
GLUCOSE: 104 mg/dL — AB (ref 65–99)
POTASSIUM: 4.5 mmol/L (ref 3.5–5.1)
Sodium: 135 mmol/L (ref 135–145)

## 2018-01-06 LAB — CBC WITH DIFFERENTIAL/PLATELET
Basophils Absolute: 0 10*3/uL (ref 0.0–0.1)
Basophils Relative: 1 %
EOS PCT: 1 %
Eosinophils Absolute: 0.1 10*3/uL (ref 0.0–0.7)
HCT: 44.3 % (ref 39.0–52.0)
Hemoglobin: 14.9 g/dL (ref 13.0–17.0)
LYMPHS ABS: 1 10*3/uL (ref 0.7–4.0)
LYMPHS PCT: 22 %
MCH: 33.1 pg (ref 26.0–34.0)
MCHC: 33.6 g/dL (ref 30.0–36.0)
MCV: 98.4 fL (ref 78.0–100.0)
MONO ABS: 0.6 10*3/uL (ref 0.1–1.0)
Monocytes Relative: 12 %
Neutro Abs: 3 10*3/uL (ref 1.7–7.7)
Neutrophils Relative %: 64 %
PLATELETS: 178 10*3/uL (ref 150–400)
RBC: 4.5 MIL/uL (ref 4.22–5.81)
RDW: 11.7 % (ref 11.5–15.5)
WBC: 4.6 10*3/uL (ref 4.0–10.5)

## 2018-01-13 ENCOUNTER — Encounter (HOSPITAL_COMMUNITY)
Admission: RE | Disposition: A | Discharge status: Home / Self Care | Payer: Self-pay | Source: Ambulatory Visit | Attending: Gastroenterology

## 2018-01-13 ENCOUNTER — Ambulatory Visit (HOSPITAL_COMMUNITY): Payer: BLUE CROSS/BLUE SHIELD | Admitting: Anesthesiology

## 2018-01-13 ENCOUNTER — Ambulatory Visit (HOSPITAL_COMMUNITY)
Admission: RE | Admit: 2018-01-13 | Discharge: 2018-01-13 | Disposition: A | Discharge status: Home / Self Care | Payer: BLUE CROSS/BLUE SHIELD | Source: Ambulatory Visit | Attending: Gastroenterology | Admitting: Gastroenterology

## 2018-01-13 ENCOUNTER — Encounter (HOSPITAL_COMMUNITY): Payer: Self-pay | Admitting: Certified Registered"

## 2018-01-13 DIAGNOSIS — F329 Major depressive disorder, single episode, unspecified: Secondary | ICD-10-CM | POA: Diagnosis not present

## 2018-01-13 DIAGNOSIS — E785 Hyperlipidemia, unspecified: Secondary | ICD-10-CM | POA: Diagnosis not present

## 2018-01-13 DIAGNOSIS — D122 Benign neoplasm of ascending colon: Secondary | ICD-10-CM | POA: Diagnosis not present

## 2018-01-13 DIAGNOSIS — F419 Anxiety disorder, unspecified: Secondary | ICD-10-CM | POA: Diagnosis not present

## 2018-01-13 DIAGNOSIS — Z79899 Other long term (current) drug therapy: Secondary | ICD-10-CM | POA: Diagnosis not present

## 2018-01-13 DIAGNOSIS — Z87891 Personal history of nicotine dependence: Secondary | ICD-10-CM | POA: Diagnosis not present

## 2018-01-13 DIAGNOSIS — Z880 Allergy status to penicillin: Secondary | ICD-10-CM | POA: Insufficient documentation

## 2018-01-13 DIAGNOSIS — K644 Residual hemorrhoidal skin tags: Secondary | ICD-10-CM | POA: Diagnosis not present

## 2018-01-13 DIAGNOSIS — Z86010 Personal history of colonic polyps: Secondary | ICD-10-CM | POA: Diagnosis not present

## 2018-01-13 DIAGNOSIS — Q438 Other specified congenital malformations of intestine: Secondary | ICD-10-CM | POA: Insufficient documentation

## 2018-01-13 DIAGNOSIS — K589 Irritable bowel syndrome without diarrhea: Secondary | ICD-10-CM | POA: Insufficient documentation

## 2018-01-13 DIAGNOSIS — Z1211 Encounter for screening for malignant neoplasm of colon: Secondary | ICD-10-CM | POA: Insufficient documentation

## 2018-01-13 HISTORY — PX: COLONOSCOPY WITH PROPOFOL: SHX5780

## 2018-01-13 HISTORY — PX: POLYPECTOMY: SHX5525

## 2018-01-13 SURGERY — COLONOSCOPY WITH PROPOFOL
Anesthesia: Monitor Anesthesia Care

## 2018-01-13 MED ORDER — LIDOCAINE HCL (PF) 1 % IJ SOLN
INTRAMUSCULAR | Status: AC
  Filled 2018-01-13: qty 5

## 2018-01-13 MED ORDER — STERILE WATER FOR IRRIGATION IR SOLN
Status: DC | PRN
  Administered 2018-01-13: 100 mL

## 2018-01-13 MED ORDER — FENTANYL CITRATE (PF) 100 MCG/2ML IJ SOLN
INTRAMUSCULAR | Status: AC
  Filled 2018-01-13: qty 2

## 2018-01-13 MED ORDER — PROPOFOL 500 MG/50ML IV EMUL
INTRAVENOUS | Status: DC | PRN
  Administered 2018-01-13: 100 ug/kg/min via INTRAVENOUS

## 2018-01-13 MED ORDER — LACTATED RINGERS IV SOLN
INTRAVENOUS | Status: DC
  Administered 2018-01-13: 1000 mL via INTRAVENOUS

## 2018-01-13 MED ORDER — PROPOFOL 10 MG/ML IV BOLUS
INTRAVENOUS | Status: DC | PRN
  Administered 2018-01-13: 50 mg via INTRAVENOUS

## 2018-01-13 MED ORDER — FENTANYL CITRATE (PF) 100 MCG/2ML IJ SOLN
25.0000 ug | Freq: Once | INTRAMUSCULAR | Status: AC
  Administered 2018-01-13: 25 ug via INTRAVENOUS

## 2018-01-13 MED ORDER — MIDAZOLAM HCL 2 MG/2ML IJ SOLN
INTRAMUSCULAR | Status: AC
  Filled 2018-01-13: qty 2

## 2018-01-13 MED ORDER — LIDOCAINE HCL (PF) 1 % IJ SOLN
INTRAMUSCULAR | Status: DC | PRN
  Administered 2018-01-13: 20 mg

## 2018-01-13 MED ORDER — MIDAZOLAM HCL 2 MG/2ML IJ SOLN
1.0000 mg | INTRAMUSCULAR | Status: AC
  Administered 2018-01-13 (×2): 2 mg via INTRAVENOUS
  Filled 2018-01-13: qty 2

## 2018-01-13 MED ORDER — PROPOFOL 10 MG/ML IV BOLUS
INTRAVENOUS | Status: AC
  Filled 2018-01-13: qty 40

## 2018-01-13 NOTE — Op Note (Signed)
[facility] Patient Name: Zachary Adams Procedure Date: 01/13/2018 7:11 AM MRN: [id number] Date of Birth: February 24, 1961 Attending MD: Barney Drain MD, MD CSN: [id number] Age: 57 Admit Type: Outpatient Procedure:                Colonoscopy WITH COLD FORCEPS POLYPECTOMY Indications:              Personal history of colonic polyps. DRANK CHICKEN                            BROTH WITH OIL DROPLETS Providers:                Barney Drain MD, MD, Rosina Lowenstein, RN, Aram Candela Referring MD:             Mitzie Na. Daniel MD, MD Medicines:                Propofol per Anesthesia Complications:            No immediate complications. Estimated Blood Loss:     Estimated blood loss was minimal. Procedure:                Pre-Anesthesia Assessment:                           - Prior to the procedure, a History and Physical                            was performed, and patient medications and                            allergies were reviewed. The patient's tolerance of                            previous anesthesia was also reviewed. The risks                            and benefits of the procedure and the sedation                            options and risks were discussed with the patient.                            All questions were answered, and informed consent                            was obtained. Prior Anticoagulants: The patient has                            taken no previous anticoagulant or antiplatelet                            agents. ASA Grade Assessment: II - A patient with                            mild systemic disease. After reviewing the risks  and benefits, the patient was deemed in                            satisfactory condition to undergo the procedure.                            After obtaining informed consent, the colonoscope                            was passed under direct vision. Throughout the                            procedure, the  patient's blood pressure, pulse, and                            oxygen saturations were monitored continuously. The                            EC-[id number] ([id number]) scope was introduced through                            the anus and advanced to the the cecum, identified                            by appendiceal orifice and ileocecal valve. The                            colonoscopy was somewhat difficult due to a                            tortuous colon. Successful completion of the                            procedure was aided by increasing the dose of                            sedation medication, straightening and shortening                            the scope to obtain bowel loop reduction and                            COLOWRAP. The patient tolerated the procedure well.                            The quality of the bowel preparation was good BUT                            OCCASIONAL VIEWS OBSCURED BY FILM CREATED BY THE                            OIL DROPLETS. The ileocecal valve, appendiceal  orifice, and rectum were photographed. Scope In: 3:89:37 AM Scope Out: 7:59:19 AM Scope Withdrawal Time: 0 hours 10 minutes 25 seconds  Total Procedure Duration: 0 hours 13 minutes 8 seconds  Findings:      A 3 mm polyp was found in the proximal ascending colon. The polyp was       sessile. The polyp was removed with a cold biopsy forceps. Resection and       retrieval were complete.      The recto-sigmoid colon and sigmoid colon were mildly redundant.      External hemorrhoids were found during retroflexion. The hemorrhoids       were small. Impression:               - One 3 mm polyp in the proximal ascending colon,                            removed with a cold biopsy forceps. Resected and                            retrieved.                           - Redundant LEFT colon.                           - External hemorrhoids. Moderate Sedation:      Per  Anesthesia Care Recommendation:           - Repeat colonoscopy in 5 years for surveillance.                            PT SHOULD AVOID CHICKEN BROTH WITH OIL DROPLETS.                           - High fiber diet.                           - Continue present medications.                           - Await pathology results.                           - Patient has a contact number available for                            emergencies. The signs and symptoms of potential                            delayed complications were discussed with the                            patient. Return to normal activities tomorrow.                            Written discharge instructions were provided to the  patient. Procedure Code(s):        --- Professional ---                           [id number], Colonoscopy, flexible; with biopsy, single                            or multiple Diagnosis Code(s):        --- Professional ---                           D12.2, Benign neoplasm of ascending colon                           K64.4, Residual hemorrhoidal skin tags                           Z86.010, Personal history of colonic polyps                           Q43.8, Other specified congenital malformations of                            intestine CPT copyright 2016 [employer]. All rights reserved. The codes documented in this report are preliminary and upon coder review may  be revised to meet current compliance requirements. Barney Drain, MD Barney Drain MD, MD 01/13/2018 8:09:06 AM This report has been signed electronically. Number of Addenda: 0

## 2018-01-13 NOTE — Transfer of Care (Signed)
Immediate Anesthesia Transfer of Care Note  Patient: Zachary Adams  Procedure(s) Performed: COLONOSCOPY WITH PROPOFOL (N/A ) POLYPECTOMY  Patient Location: PACU  Anesthesia Type:MAC  Level of Consciousness: awake, alert , oriented and patient cooperative  Airway & Oxygen Therapy: Patient Spontanous Breathing  Post-op Assessment: Report given to RN and Post -op Vital signs reviewed and stable  Post vital signs: Reviewed and stable  Last Vitals:  Vitals:   01/13/18 0715 01/13/18 0730  BP: (!) 157/106   Resp: 15 17  Temp:    SpO2: 96% 97%    Last Pain: There were no vitals filed for this visit.       Complications: No apparent anesthesia complications

## 2018-01-13 NOTE — Discharge Instructions (Signed)
You had 1 small polyp removed. You have  SMALL external hemorrhoids.   DRINK WATER TO KEEP YOUR URINE LIGHT YELLOW.  FOLLOW A HIGH FIBER DIET. AVOID ITEMS THAT CAUSE BLOATING & GAS. SEE INFO BELOW.  YOUR BIOPSY RESULTS WILL BE AVAILABLE IN 7 days.  Next colonoscopy in 5 years. DO NOT DRINK CHICKEN BROTH WITH OIL DROPLETS BEFORE YOUR NEXT COLONOSCOPY. IT CREATES A GREASY FILM ON THE LENS OF THE SCOPE AND REDUCES OUR ABILITY TO SEE POLYPS.    Colonoscopy Care After Read the instructions outlined below and refer to this sheet in the next week. These discharge instructions provide you with general information on caring for yourself after you leave the hospital. While your treatment has been planned according to the most current medical practices available, unavoidable complications occasionally occur. If you have any problems or questions after discharge, call DR. FIELDS, [phone].  ACTIVITY  You may resume your regular activity, but move at a slower pace for the next 24 hours.   Take frequent rest periods for the next 24 hours.   Walking will help get rid of the air and reduce the bloated feeling in your belly (abdomen).   No driving for 24 hours (because of the medicine (anesthesia) used during the test).   You may shower.   Do not sign any important legal documents or operate any machinery for 24 hours (because of the anesthesia used during the test).    NUTRITION  Drink plenty of fluids.   You may resume your normal diet as instructed by your doctor.   Begin with a light meal and progress to your normal diet. Heavy or fried foods are harder to digest and may make you feel sick to your stomach (nauseated).   Avoid alcoholic beverages for 24 hours or as instructed.    MEDICATIONS  You may resume your normal medications.   WHAT YOU CAN EXPECT TODAY  Some feelings of bloating in the abdomen.   Passage of more gas than usual.   Spotting of blood in your stool or on  the toilet paper  .  IF YOU HAD POLYPS REMOVED DURING THE COLONOSCOPY:  Eat a soft diet IF YOU HAVE NAUSEA, BLOATING, ABDOMINAL PAIN, OR VOMITING.    FINDING OUT THE RESULTS OF YOUR TEST Not all test results are available during your visit. DR. Oneida Alar WILL CALL YOU WITHIN 14 DAYS OF YOUR PROCEDUE WITH YOUR RESULTS. Do not assume everything is normal if you have not heard from DR. FIELDS, CALL HER OFFICE AT [phone].  SEEK IMMEDIATE MEDICAL ATTENTION AND CALL THE OFFICE: [phone] IF:  You have more than a spotting of blood in your stool.   Your belly is swollen (abdominal distention).   You are nauseated or vomiting.   You have a temperature over 101F.   You have abdominal pain or discomfort that is severe or gets worse throughout the day.   High-Fiber Diet A high-fiber diet changes your normal diet to include more whole grains, legumes, fruits, and vegetables. Changes in the diet involve replacing refined carbohydrates with unrefined foods. The calorie level of the diet is essentially unchanged. The Dietary Reference Intake (recommended amount) for adult males is 38 grams per day. For adult females, it is 25 grams per day. Pregnant and lactating women should consume 28 grams of fiber per day. Fiber is the intact part of a plant that is not broken down during digestion. Functional fiber is fiber that has been isolated from the plant  to provide a beneficial effect in the body. PURPOSE  Increase stool bulk.   Ease and regulate bowel movements.   Lower cholesterol.   REDUCE RISK OF COLON CANCER  INDICATIONS THAT YOU NEED MORE FIBER  Constipation and hemorrhoids.   Uncomplicated diverticulosis (intestine condition) and irritable bowel syndrome.   Weight management.   As a protective measure against hardening of the arteries (atherosclerosis), diabetes, and cancer.   GUIDELINES FOR INCREASING FIBER IN THE DIET  Start adding fiber to the diet slowly. A gradual  increase of about 5 more grams (2 slices of whole-wheat bread, 2 servings of most fruits or vegetables, or 1 bowl of high-fiber cereal) per day is best. Too rapid an increase in fiber may result in constipation, flatulence, and bloating.   Drink enough water and fluids to keep your urine clear or pale yellow. Water, juice, or caffeine-free drinks are recommended. Not drinking enough fluid may cause constipation.   Eat a variety of high-fiber foods rather than one type of fiber.   Try to increase your intake of fiber through using high-fiber foods rather than fiber pills or supplements that contain small amounts of fiber.   The goal is to change the types of food eaten. Do not supplement your present diet with high-fiber foods, but replace foods in your present diet.   INCLUDE A VARIETY OF FIBER SOURCES  Replace refined and processed grains with whole grains, canned fruits with fresh fruits, and incorporate other fiber sources. White rice, white breads, and most bakery goods contain little or no fiber.   Brown whole-grain rice, buckwheat oats, and many fruits and vegetables are all good sources of fiber. These include: broccoli, Brussels sprouts, cabbage, cauliflower, beets, sweet potatoes, white potatoes (skin on), carrots, tomatoes, eggplant, squash, berries, fresh fruits, and dried fruits.   Cereals appear to be the richest source of fiber. Cereal fiber is found in whole grains and bran. Bran is the fiber-rich outer coat of cereal grain, which is largely removed in refining. In whole-grain cereals, the bran remains. In breakfast cereals, the largest amount of fiber is found in those with "bran" in their names. The fiber content is sometimes indicated on the label.   You may need to include additional fruits and vegetables each day.   In baking, for 1 cup white flour, you may use the following substitutions:   1 cup whole-wheat flour minus 2 tablespoons.   1/2 cup white flour plus 1/2 cup  whole-wheat flour.   Polyps, Colon  A polyp is extra tissue that grows inside your body. Colon polyps grow in the large intestine. The large intestine, also called the colon, is part of your digestive system. It is a long, hollow tube at the end of your digestive tract where your body makes and stores stool. Most polyps are not dangerous. They are benign. This means they are not cancerous. But over time, some types of polyps can turn into cancer. Polyps that are smaller than a pea are usually not harmful. But larger polyps could someday become or may already be cancerous. To be safe, doctors remove all polyps and test them.   WHO GETS POLYPS? Anyone can get polyps, but certain people are more likely than others. You may have a greater chance of getting polyps if:  You are over 50.   You have had polyps before.   Someone in your family has had polyps.   Someone in your family has had cancer of the large intestine.  Find out if someone in your family has had polyps. You may also be more likely to get polyps if you:   Eat a lot of fatty foods   Smoke   Drink alcohol   Do not exercise  Eat too much   PREVENTION There is not one sure way to prevent polyps. You might be able to lower your risk of getting them if you:  Eat more fruits and vegetables and less fatty food.   Do not smoke.   Avoid alcohol.   Exercise every day.   Lose weight if you are overweight.   Eating more calcium and folate can also lower your risk of getting polyps. Some foods that are rich in calcium are milk, cheese, and broccoli. Some foods that are rich in folate are chickpeas, kidney beans, and spinach.   Hemorrhoids Hemorrhoids are dilated (enlarged) veins around the rectum. Sometimes clots will form in the veins. This makes them swollen and painful. These are called thrombosed hemorrhoids. Causes of hemorrhoids include:  Constipation.   Straining to have a bowel movement.   HEAVY LIFTING  HOME  CARE INSTRUCTIONS  Eat a well balanced diet and drink 6 to 8 glasses of water every day to avoid constipation. You may also use a bulk laxative.   Avoid straining to have bowel movements.   Keep anal area dry and clean.   Do not use a donut shaped pillow or sit on the toilet for long periods. This increases blood pooling and pain.   Move your bowels when your body has the urge; this will require less straining and will decrease pain and pressure.

## 2018-01-13 NOTE — Anesthesia Postprocedure Evaluation (Signed)
Anesthesia Post Note  Patient: Zachary Adams  Procedure(s) Performed: COLONOSCOPY WITH PROPOFOL (N/A ) POLYPECTOMY  Patient location during evaluation: PACU Anesthesia Type: MAC Level of consciousness: oriented, awake and alert and patient cooperative Pain management: satisfactory to patient Vital Signs Assessment: post-procedure vital signs reviewed and stable Respiratory status: spontaneous breathing Cardiovascular status: blood pressure returned to baseline and stable Postop Assessment: no apparent nausea or vomiting Anesthetic complications: no     Last Vitals:  Vitals:   01/13/18 0730 01/13/18 0804  BP:  (!) 124/92  Pulse:  90  Resp: 17 20  Temp:  36.6 C  SpO2: 97% 97%    Last Pain: There were no vitals filed for this visit.               Verlin Grills

## 2018-01-13 NOTE — Anesthesia Preprocedure Evaluation (Signed)
Anesthesia Evaluation  Patient identified by MRN, date of birth, ID band Patient awake    Reviewed: Allergy & Precautions, NPO status , Patient's Chart, lab work & pertinent test results  Airway Mallampati: II  TM Distance: >3 FB Neck ROM: Full    Dental  (+) Teeth Intact   Pulmonary former smoker,    breath sounds clear to auscultation       Cardiovascular negative cardio ROS   Rhythm:Regular Rate:Normal     Neuro/Psych PSYCHIATRIC DISORDERS Anxiety Depression    GI/Hepatic Neg liver ROS, IBS    Endo/Other  negative endocrine ROS  Renal/GU      Musculoskeletal negative musculoskeletal ROS (+)   Abdominal   Peds  Hematology negative hematology ROS (+)   Anesthesia Other Findings   Reproductive/Obstetrics                             Anesthesia Physical Anesthesia Plan  ASA: II  Anesthesia Plan: MAC   Post-op Pain Management:    Induction: Intravenous  PONV Risk Score and Plan:   Airway Management Planned: Simple Face Mask  Additional Equipment:   Intra-op Plan:   Post-operative Plan:   Informed Consent: I have reviewed the patients History and Physical, chart, labs and discussed the procedure including the risks, benefits and alternatives for the proposed anesthesia with the patient or authorized representative who has indicated his/her understanding and acceptance.     Plan Discussed with:   Anesthesia Plan Comments:         Anesthesia Quick Evaluation

## 2018-01-13 NOTE — H&P (Signed)
Primary Care Physician:  Caryl Bis, MD Primary Gastroenterologist:  Dr. Oneida Alar  Pre-Procedure History & Physical: HPI:  Zachary Adams is a 57 y.o. male here for  PERSONAL HISTORY OF POLYPS.  Past Medical History:  Diagnosis Date  . Adenomatous colon polyp 2008  . Anxiety   . Depression   . Hyperlipemia   . IBS (irritable bowel syndrome)     Past Surgical History:  Procedure Laterality Date  . COLONOSCOPY  03/13/2007     SLF: A 5 mm sessile sigmoid adenomatous polyp removed   . COLONOSCOPY N/A 12/15/2012   Dr. Oneida Alar: multiple sessile polyps (12), mild diverticulosis in sigmoid colon, moderate internal hemorrhoids. path with serrated adenoma and hyperplastic polyps.   . ESOPHAGOGASTRODUODENOSCOPY  03/13/2007     QQP:YPPJKD esophagus without evidence of erosion, Barrett's or ulcers/ Normal stomach, duodenal bulb and second portion of the duodenum    Prior to Admission medications   Medication Sig Start Date End Date Taking? Authorizing Provider  ALPRAZolam Duanne Moron) 0.5 MG tablet Take 0.5 mg by mouth daily. May take a second dose of 0.25 mg as needed for anxiety 11/09/12  Yes [provider]  Ascorbic Acid (VITAMIN C) 1000 MG tablet Take 1,000 mg by mouth daily.   Yes [provider]  CYMBALTA 30 MG capsule Take 90 mg by mouth daily.  09/09/12  Yes [provider]  ibuprofen (ADVIL,MOTRIN) 200 MG tablet Take 800 mg by mouth 3 (three) times daily as needed for headache or moderate pain.   Yes [provider]  loratadine (CLARITIN) 10 MG tablet Take 10 mg by mouth daily.   Yes [provider]  Multiple Vitamin (MULTIVITAMIN) tablet Take 1 tablet by mouth daily.   Yes [provider]  simvastatin (ZOCOR) 40 MG tablet Take 40 mg by mouth at bedtime.  11/19/12  Yes [provider]  triamcinolone cream (KENALOG) 0.1 % APPLY TO AFFECTED AREA TWICE DAILY 11/17/17  Yes [provider]  polyethylene glycol-electrolytes  (TRILYTE) 420 g solution Take 4,000 mLs by mouth as directed. 12/10/17   Danie Binder, MD    Allergies as of 12/10/2017 - Review Complete 12/10/2017  Allergen Reaction Noted  . Penicillins Other (See Comments) 12/03/2012    Family History  Problem Relation Age of Onset  . Parkinsonism Father   . Osteoporosis Mother   . Colon cancer Neg Hx     Social History   Socioeconomic History  . Marital status: Divorced    Spouse name: Not on file  . Number of children: 2  . Years of education: Not on file  . Highest education level: Not on file  Social Needs  . Financial resource strain: Not on file  . Food insecurity - worry: Not on file  . Food insecurity - inability: Not on file  . Transportation needs - medical: Not on file  . Transportation needs - non-medical: Not on file  Occupational History  . Occupation: self employed, horse shoes  Tobacco Use  . Smoking status: Former Smoker    Packs/day: 2.00    Years: 30.00    Pack years: 60.00    Types: Cigars    Last attempt to quit: 03/19/2017    Years since quitting: 0.8  . Smokeless tobacco: Never Used  . Tobacco comment: quit cigarettes 2000, quit cigar May 2018   Substance and Sexual Activity  . Alcohol use: Yes    Comment: about a 12 pack each night of beer chronically   .  Drug use: No  . Sexual activity: Not on file  Other Topics Concern  . Not on file  Social History Narrative   Lives w/ youngest son.  Divorced.    Review of Systems: See HPI, otherwise negative ROS   Physical Exam: BP (!) 157/105   Temp 97.7 F (36.5 C)   Resp 18   SpO2 96%  General:   Alert,  pleasant and cooperative in NAD Head:  Normocephalic and atraumatic. Neck:  Supple; Lungs:  Clear throughout to auscultation.    Heart:  Regular rate and rhythm. Abdomen:  Soft, nontender and nondistended. Normal bowel sounds, without guarding, and without rebound.   Neurologic:  Alert and  oriented x4;  grossly normal  neurologically.  Impression/Plan:     PERSONAL HISTORY OF POLYPS.  PLAN: 1. TCS TODAY DISCUSSED PROCEDURE, BENEFITS, & RISKS: < 1% chance of medication reaction, bleeding, perforation, or rupture of spleen/liver.

## 2018-01-13 NOTE — Addendum Note (Signed)
Addendum  created 01/13/18 0956 by Ollen Bowl, CRNA   Charge Capture section accepted

## 2018-01-16 ENCOUNTER — Telehealth: Payer: Self-pay | Admitting: Gastroenterology

## 2018-01-16 NOTE — Telephone Encounter (Signed)
ONE SIMPLE ADENOMA REMOVED.    DRINK WATER TO KEEP YOUR URINE LIGHT YELLOW.  FOLLOW A HIGH FIBER DIET. AVOID ITEMS THAT CAUSE BLOATING &   Next colonoscopy in 5 years. DO NOT DRINK CHICKEN BROTH WITH OIL DROPLETS BEFORE YOUR NEXT COLONOSCOPY.

## 2018-01-19 ENCOUNTER — Encounter (HOSPITAL_COMMUNITY): Payer: Self-pay | Admitting: Gastroenterology

## 2018-01-19 NOTE — Telephone Encounter (Signed)
Pt is aware. He said to let Dr. Oneida Alar know that the oil came from the enema, he used a mineral oil enema.

## 2018-01-19 NOTE — Telephone Encounter (Signed)
LMOM to call.

## 2018-01-20 NOTE — Telephone Encounter (Signed)
REVIEWED.  

## 2018-03-23 DIAGNOSIS — Y929 Unspecified place or not applicable: Secondary | ICD-10-CM | POA: Diagnosis not present

## 2018-03-23 DIAGNOSIS — F1010 Alcohol abuse, uncomplicated: Secondary | ICD-10-CM | POA: Diagnosis not present

## 2018-03-23 DIAGNOSIS — W1789XA Other fall from one level to another, initial encounter: Secondary | ICD-10-CM | POA: Diagnosis not present

## 2018-03-23 DIAGNOSIS — S0990XA Unspecified injury of head, initial encounter: Secondary | ICD-10-CM | POA: Diagnosis not present

## 2018-03-23 DIAGNOSIS — S01312A Laceration without foreign body of left ear, initial encounter: Secondary | ICD-10-CM | POA: Diagnosis not present

## 2018-03-23 DIAGNOSIS — S12100A Unspecified displaced fracture of second cervical vertebra, initial encounter for closed fracture: Secondary | ICD-10-CM | POA: Diagnosis not present

## 2018-03-23 DIAGNOSIS — Y9389 Activity, other specified: Secondary | ICD-10-CM | POA: Diagnosis not present

## 2018-03-23 DIAGNOSIS — W19XXXA Unspecified fall, initial encounter: Secondary | ICD-10-CM | POA: Diagnosis not present

## 2018-03-23 DIAGNOSIS — W07XXXA Fall from chair, initial encounter: Secondary | ICD-10-CM | POA: Diagnosis not present

## 2018-03-23 DIAGNOSIS — F329 Major depressive disorder, single episode, unspecified: Secondary | ICD-10-CM | POA: Diagnosis not present

## 2018-03-23 DIAGNOSIS — S12690A Other displaced fracture of seventh cervical vertebra, initial encounter for closed fracture: Secondary | ICD-10-CM | POA: Diagnosis not present

## 2018-03-23 DIAGNOSIS — E785 Hyperlipidemia, unspecified: Secondary | ICD-10-CM | POA: Diagnosis not present

## 2018-03-23 DIAGNOSIS — Y92008 Other place in unspecified non-institutional (private) residence as the place of occurrence of the external cause: Secondary | ICD-10-CM | POA: Diagnosis not present

## 2018-03-23 DIAGNOSIS — S12691A Other nondisplaced fracture of seventh cervical vertebra, initial encounter for closed fracture: Secondary | ICD-10-CM | POA: Diagnosis not present

## 2018-03-23 DIAGNOSIS — S12600A Unspecified displaced fracture of seventh cervical vertebra, initial encounter for closed fracture: Secondary | ICD-10-CM | POA: Diagnosis not present

## 2018-03-23 DIAGNOSIS — S02401A Maxillary fracture, unspecified, initial encounter for closed fracture: Secondary | ICD-10-CM | POA: Diagnosis not present

## 2018-03-23 DIAGNOSIS — Z79899 Other long term (current) drug therapy: Secondary | ICD-10-CM | POA: Diagnosis not present

## 2018-03-23 DIAGNOSIS — R55 Syncope and collapse: Secondary | ICD-10-CM | POA: Diagnosis not present

## 2018-03-23 DIAGNOSIS — R740 Nonspecific elevation of levels of transaminase and lactic acid dehydrogenase [LDH]: Secondary | ICD-10-CM | POA: Diagnosis not present

## 2018-03-23 DIAGNOSIS — F419 Anxiety disorder, unspecified: Secondary | ICD-10-CM | POA: Diagnosis not present

## 2018-03-23 DIAGNOSIS — S299XXA Unspecified injury of thorax, initial encounter: Secondary | ICD-10-CM | POA: Diagnosis not present

## 2018-03-24 DIAGNOSIS — R55 Syncope and collapse: Secondary | ICD-10-CM | POA: Diagnosis not present

## 2018-03-24 DIAGNOSIS — S12690A Other displaced fracture of seventh cervical vertebra, initial encounter for closed fracture: Secondary | ICD-10-CM | POA: Diagnosis not present

## 2018-03-24 DIAGNOSIS — F1010 Alcohol abuse, uncomplicated: Secondary | ICD-10-CM | POA: Diagnosis not present

## 2018-03-24 DIAGNOSIS — S12601A Unspecified nondisplaced fracture of seventh cervical vertebra, initial encounter for closed fracture: Secondary | ICD-10-CM | POA: Diagnosis not present

## 2018-03-24 DIAGNOSIS — S01312A Laceration without foreign body of left ear, initial encounter: Secondary | ICD-10-CM | POA: Diagnosis not present

## 2018-03-24 DIAGNOSIS — S12600A Unspecified displaced fracture of seventh cervical vertebra, initial encounter for closed fracture: Secondary | ICD-10-CM | POA: Diagnosis not present

## 2018-03-24 DIAGNOSIS — W1789XA Other fall from one level to another, initial encounter: Secondary | ICD-10-CM | POA: Diagnosis not present

## 2018-03-25 DIAGNOSIS — S12690D Other displaced fracture of seventh cervical vertebra, subsequent encounter for fracture with routine healing: Secondary | ICD-10-CM | POA: Diagnosis not present

## 2018-03-25 DIAGNOSIS — R55 Syncope and collapse: Secondary | ICD-10-CM | POA: Diagnosis not present

## 2018-03-25 DIAGNOSIS — S01312D Laceration without foreign body of left ear, subsequent encounter: Secondary | ICD-10-CM | POA: Diagnosis not present

## 2018-03-25 DIAGNOSIS — S12600A Unspecified displaced fracture of seventh cervical vertebra, initial encounter for closed fracture: Secondary | ICD-10-CM | POA: Diagnosis not present

## 2018-03-25 DIAGNOSIS — W1789XD Other fall from one level to another, subsequent encounter: Secondary | ICD-10-CM | POA: Diagnosis not present

## 2018-03-25 DIAGNOSIS — E785 Hyperlipidemia, unspecified: Secondary | ICD-10-CM | POA: Diagnosis not present

## 2018-03-31 DIAGNOSIS — Z6825 Body mass index (BMI) 25.0-25.9, adult: Secondary | ICD-10-CM | POA: Diagnosis not present

## 2018-03-31 DIAGNOSIS — S12691A Other nondisplaced fracture of seventh cervical vertebra, initial encounter for closed fracture: Secondary | ICD-10-CM | POA: Diagnosis not present

## 2018-04-07 DIAGNOSIS — M25511 Pain in right shoulder: Secondary | ICD-10-CM | POA: Diagnosis not present

## 2018-04-07 DIAGNOSIS — Z87891 Personal history of nicotine dependence: Secondary | ICD-10-CM | POA: Diagnosis not present

## 2018-04-07 DIAGNOSIS — S129XXD Fracture of neck, unspecified, subsequent encounter: Secondary | ICD-10-CM | POA: Diagnosis not present

## 2018-04-07 DIAGNOSIS — S12600D Unspecified displaced fracture of seventh cervical vertebra, subsequent encounter for fracture with routine healing: Secondary | ICD-10-CM | POA: Diagnosis not present

## 2018-04-07 DIAGNOSIS — X58XXXD Exposure to other specified factors, subsequent encounter: Secondary | ICD-10-CM | POA: Diagnosis not present

## 2018-04-10 DIAGNOSIS — Z09 Encounter for follow-up examination after completed treatment for conditions other than malignant neoplasm: Secondary | ICD-10-CM | POA: Diagnosis not present

## 2018-04-27 DIAGNOSIS — Z0001 Encounter for general adult medical examination with abnormal findings: Secondary | ICD-10-CM | POA: Diagnosis not present

## 2018-05-04 DIAGNOSIS — R7301 Impaired fasting glucose: Secondary | ICD-10-CM | POA: Diagnosis not present

## 2018-05-04 DIAGNOSIS — Z0001 Encounter for general adult medical examination with abnormal findings: Secondary | ICD-10-CM | POA: Diagnosis not present

## 2018-05-04 DIAGNOSIS — F410 Panic disorder [episodic paroxysmal anxiety] without agoraphobia: Secondary | ICD-10-CM | POA: Diagnosis not present

## 2018-05-04 DIAGNOSIS — F1010 Alcohol abuse, uncomplicated: Secondary | ICD-10-CM | POA: Diagnosis not present

## 2018-05-04 DIAGNOSIS — E782 Mixed hyperlipidemia: Secondary | ICD-10-CM | POA: Diagnosis not present

## 2018-05-05 DIAGNOSIS — Z87891 Personal history of nicotine dependence: Secondary | ICD-10-CM | POA: Diagnosis not present

## 2018-05-05 DIAGNOSIS — S12600D Unspecified displaced fracture of seventh cervical vertebra, subsequent encounter for fracture with routine healing: Secondary | ICD-10-CM | POA: Diagnosis not present

## 2018-05-05 DIAGNOSIS — X58XXXD Exposure to other specified factors, subsequent encounter: Secondary | ICD-10-CM | POA: Diagnosis not present

## 2018-06-16 DIAGNOSIS — S12600D Unspecified displaced fracture of seventh cervical vertebra, subsequent encounter for fracture with routine healing: Secondary | ICD-10-CM | POA: Diagnosis not present

## 2018-06-16 DIAGNOSIS — M47812 Spondylosis without myelopathy or radiculopathy, cervical region: Secondary | ICD-10-CM | POA: Diagnosis not present

## 2018-06-16 DIAGNOSIS — M4802 Spinal stenosis, cervical region: Secondary | ICD-10-CM | POA: Diagnosis not present

## 2018-06-16 DIAGNOSIS — M4312 Spondylolisthesis, cervical region: Secondary | ICD-10-CM | POA: Diagnosis not present

## 2018-06-16 DIAGNOSIS — S12690D Other displaced fracture of seventh cervical vertebra, subsequent encounter for fracture with routine healing: Secondary | ICD-10-CM | POA: Diagnosis not present

## 2018-07-16 ENCOUNTER — Encounter: Payer: Self-pay | Admitting: Internal Medicine

## 2018-09-16 DIAGNOSIS — S12600D Unspecified displaced fracture of seventh cervical vertebra, subsequent encounter for fracture with routine healing: Secondary | ICD-10-CM | POA: Diagnosis not present

## 2018-09-16 DIAGNOSIS — S12690D Other displaced fracture of seventh cervical vertebra, subsequent encounter for fracture with routine healing: Secondary | ICD-10-CM | POA: Diagnosis not present

## 2018-09-16 DIAGNOSIS — M4312 Spondylolisthesis, cervical region: Secondary | ICD-10-CM | POA: Diagnosis not present

## 2018-09-16 DIAGNOSIS — M4802 Spinal stenosis, cervical region: Secondary | ICD-10-CM | POA: Diagnosis not present

## 2018-09-16 DIAGNOSIS — M47892 Other spondylosis, cervical region: Secondary | ICD-10-CM | POA: Diagnosis not present

## 2018-09-21 NOTE — Progress Notes (Signed)
This encounter was created in error - please disregard.

## 2018-11-02 DIAGNOSIS — Z9189 Other specified personal risk factors, not elsewhere classified: Secondary | ICD-10-CM | POA: Diagnosis not present

## 2018-11-02 DIAGNOSIS — E782 Mixed hyperlipidemia: Secondary | ICD-10-CM | POA: Diagnosis not present

## 2018-11-02 DIAGNOSIS — I10 Essential (primary) hypertension: Secondary | ICD-10-CM | POA: Diagnosis not present

## 2018-11-02 DIAGNOSIS — R7301 Impaired fasting glucose: Secondary | ICD-10-CM | POA: Diagnosis not present

## 2018-11-02 DIAGNOSIS — F17210 Nicotine dependence, cigarettes, uncomplicated: Secondary | ICD-10-CM | POA: Diagnosis not present

## 2018-11-09 DIAGNOSIS — F331 Major depressive disorder, recurrent, moderate: Secondary | ICD-10-CM | POA: Diagnosis not present

## 2018-11-09 DIAGNOSIS — F410 Panic disorder [episodic paroxysmal anxiety] without agoraphobia: Secondary | ICD-10-CM | POA: Diagnosis not present

## 2018-11-09 DIAGNOSIS — R7301 Impaired fasting glucose: Secondary | ICD-10-CM | POA: Diagnosis not present

## 2018-11-09 DIAGNOSIS — E782 Mixed hyperlipidemia: Secondary | ICD-10-CM | POA: Diagnosis not present

## 2019-01-13 DIAGNOSIS — D1723 Benign lipomatous neoplasm of skin and subcutaneous tissue of right leg: Secondary | ICD-10-CM | POA: Diagnosis not present

## 2019-05-03 DIAGNOSIS — I10 Essential (primary) hypertension: Secondary | ICD-10-CM | POA: Diagnosis not present

## 2019-05-03 DIAGNOSIS — E782 Mixed hyperlipidemia: Secondary | ICD-10-CM | POA: Diagnosis not present

## 2019-05-03 DIAGNOSIS — R7301 Impaired fasting glucose: Secondary | ICD-10-CM | POA: Diagnosis not present

## 2019-05-10 DIAGNOSIS — Z23 Encounter for immunization: Secondary | ICD-10-CM | POA: Diagnosis not present

## 2019-05-10 DIAGNOSIS — Z0001 Encounter for general adult medical examination with abnormal findings: Secondary | ICD-10-CM | POA: Diagnosis not present

## 2020-07-12 ENCOUNTER — Other Ambulatory Visit: Payer: Self-pay | Admitting: Family Medicine

## 2020-07-12 ENCOUNTER — Other Ambulatory Visit (HOSPITAL_COMMUNITY): Payer: Self-pay | Admitting: Family Medicine

## 2020-07-12 DIAGNOSIS — F17210 Nicotine dependence, cigarettes, uncomplicated: Secondary | ICD-10-CM

## 2020-08-02 ENCOUNTER — Ambulatory Visit (HOSPITAL_COMMUNITY)
Admission: RE | Admit: 2020-08-02 | Discharge: 2020-08-02 | Disposition: A | Discharge status: Home / Self Care | Payer: 59 | Source: Ambulatory Visit | Attending: Family Medicine | Admitting: Family Medicine

## 2020-08-02 ENCOUNTER — Other Ambulatory Visit: Payer: Self-pay

## 2020-08-02 DIAGNOSIS — F17210 Nicotine dependence, cigarettes, uncomplicated: Secondary | ICD-10-CM | POA: Diagnosis not present

## 2020-08-03 ENCOUNTER — Other Ambulatory Visit: Payer: Self-pay | Admitting: Family Medicine

## 2020-08-03 ENCOUNTER — Other Ambulatory Visit (HOSPITAL_COMMUNITY): Payer: Self-pay | Admitting: Family Medicine

## 2020-08-03 DIAGNOSIS — K769 Liver disease, unspecified: Secondary | ICD-10-CM

## 2020-08-09 ENCOUNTER — Other Ambulatory Visit: Payer: Self-pay

## 2020-08-09 ENCOUNTER — Ambulatory Visit (HOSPITAL_COMMUNITY)
Admission: RE | Admit: 2020-08-09 | Discharge: 2020-08-09 | Disposition: A | Discharge status: Home / Self Care | Payer: 59 | Source: Ambulatory Visit | Attending: Family Medicine | Admitting: Family Medicine

## 2020-08-09 DIAGNOSIS — K769 Liver disease, unspecified: Secondary | ICD-10-CM | POA: Diagnosis not present

## 2020-08-10 ENCOUNTER — Other Ambulatory Visit: Payer: Self-pay | Admitting: Family Medicine

## 2020-08-10 ENCOUNTER — Other Ambulatory Visit (HOSPITAL_COMMUNITY): Payer: Self-pay | Admitting: Family Medicine

## 2020-08-10 DIAGNOSIS — K7689 Other specified diseases of liver: Secondary | ICD-10-CM

## 2021-01-17 ENCOUNTER — Ambulatory Visit (INDEPENDENT_AMBULATORY_CARE_PROVIDER_SITE_OTHER): Payer: 59 | Admitting: Internal Medicine

## 2021-01-17 ENCOUNTER — Other Ambulatory Visit (HOSPITAL_COMMUNITY)
Admission: RE | Admit: 2021-01-17 | Discharge: 2021-01-17 | Disposition: A | Discharge status: Home / Self Care | Payer: 59 | Source: Ambulatory Visit | Attending: Internal Medicine | Admitting: Internal Medicine

## 2021-01-17 ENCOUNTER — Encounter: Payer: Self-pay | Admitting: Internal Medicine

## 2021-01-17 ENCOUNTER — Other Ambulatory Visit: Payer: Self-pay

## 2021-01-17 ENCOUNTER — Other Ambulatory Visit (HOSPITAL_COMMUNITY)
Admission: RE | Admit: 2021-01-17 | Discharge: 2021-01-17 | Disposition: A | Discharge status: Home / Self Care | Payer: 59 | Source: Ambulatory Visit | Attending: Interventional Cardiology | Admitting: Interventional Cardiology

## 2021-01-17 VITALS — BP 110/74 | HR 94 | Ht 76.0 in | Wt 217.0 lb

## 2021-01-17 DIAGNOSIS — Z20822 Contact with and (suspected) exposure to covid-19: Secondary | ICD-10-CM | POA: Diagnosis not present

## 2021-01-17 DIAGNOSIS — R0602 Shortness of breath: Secondary | ICD-10-CM | POA: Diagnosis not present

## 2021-01-17 DIAGNOSIS — Z01818 Encounter for other preprocedural examination: Secondary | ICD-10-CM

## 2021-01-17 DIAGNOSIS — Z01812 Encounter for preprocedural laboratory examination: Secondary | ICD-10-CM | POA: Diagnosis not present

## 2021-01-17 LAB — LIPID PANEL
Cholesterol: 155 mg/dL (ref 0–200)
HDL: 41 mg/dL (ref >40)
LDL Cholesterol: 101 mg/dL — ABNORMAL HIGH (ref 0–99)
Total CHOL/HDL Ratio: 3.8 RATIO
Triglycerides: 63 mg/dL (ref <150)
VLDL: 13 mg/dL (ref 0–40)

## 2021-01-17 LAB — TSH: TSH: 2.619 u[IU]/mL (ref 0.350–4.500)

## 2021-01-17 LAB — BASIC METABOLIC PANEL
Anion gap: 9 (ref 5–15)
BUN: 9 mg/dL (ref 8–23)
CO2: 27 mmol/L (ref 22–32)
Calcium: 8.9 mg/dL (ref 8.9–10.3)
Chloride: 102 mmol/L (ref 98–111)
Creatinine, Ser: 0.84 mg/dL (ref 0.61–1.24)
GFR, Estimated: >60 mL/min (ref >60)
Glucose, Bld: 97 mg/dL (ref 70–99)
Potassium: 4.1 mmol/L (ref 3.5–5.1)
Sodium: 138 mmol/L (ref 135–145)

## 2021-01-17 LAB — SARS CORONAVIRUS 2 (TAT 6-24 HRS): SARS Coronavirus 2: NEGATIVE

## 2021-01-17 LAB — CBC WITH DIFFERENTIAL/PLATELET
Abs Immature Granulocytes: 0.02 10*3/uL (ref 0.00–0.07)
Basophils Absolute: 0 10*3/uL (ref 0.0–0.1)
Basophils Relative: 1 %
Eosinophils Absolute: 0.1 10*3/uL (ref 0.0–0.5)
Eosinophils Relative: 1 %
HCT: 43.7 % (ref 39.0–52.0)
Hemoglobin: 15.1 g/dL (ref 13.0–17.0)
Immature Granulocytes: 0 %
Lymphocytes Relative: 22 %
Lymphs Abs: 1.2 10*3/uL (ref 0.7–4.0)
MCH: 33.5 pg (ref 26.0–34.0)
MCHC: 34.6 g/dL (ref 30.0–36.0)
MCV: 96.9 fL (ref 80.0–100.0)
Monocytes Absolute: 0.5 10*3/uL (ref 0.1–1.0)
Monocytes Relative: 9 %
Neutro Abs: 3.8 10*3/uL (ref 1.7–7.7)
Neutrophils Relative %: 67 %
Platelets: 165 10*3/uL (ref 150–400)
RBC: 4.51 MIL/uL (ref 4.22–5.81)
RDW: 11.7 % (ref 11.5–15.5)
WBC: 5.6 10*3/uL (ref 4.0–10.5)
nRBC: 0 % (ref 0.0–0.2)

## 2021-01-17 NOTE — Patient Instructions (Signed)
Medication Instructions:  Your physician recommends that you continue on your current medications as directed. Please refer to the Current Medication list given to you today.  *If you need a refill on your cardiac medications before your next appointment, please call your pharmacy*   Lab Work: Your physician recommends that you return for lab work in: Today   If you have labs (blood work) drawn today and your tests are completely normal, you will receive your results only by: [facility] MyChart Message (if you have MyChart) OR . A paper copy in the mail If you have any lab test that is abnormal or we need to change your treatment, we will call you to review the results.   Testing/Procedures: Your physician has requested that you have a cardiac catheterization. Cardiac catheterization is used to diagnose and/or treat various heart conditions. Doctors may recommend this procedure for a number of different reasons. The most common reason is to evaluate chest pain. Chest pain can be a symptom of coronary artery disease (CAD), and cardiac catheterization can show whether plaque is narrowing or blocking your heart's arteries. This procedure is also used to evaluate the valves, as well as measure the blood flow and oxygen levels in different parts of your heart. For further information please visit [email or web address].[email or web address]. Please follow instruction sheet, as given.  Follow-Up: At [facility], you and your health needs are our priority.  As part of our continuing mission to provide you with exceptional heart care, we have created designated Provider Care Teams.  These Care Teams include your primary Cardiologist (physician) and Advanced Practice Providers (APPs -  Physician Assistants and Nurse Practitioners) who all work together to provide you with the care you need, when you need it.  We recommend signing up for the patient portal called "MyChart".  Sign up information is provided on this After Visit Summary.   MyChart is used to connect with patients for Virtual Visits (Telemedicine).  Patients are able to view lab/test results, encounter notes, upcoming appointments, etc.  Non-urgent messages can be sent to your provider as well.   To learn more about what you can do with MyChart, go to [email or web address].    Your next appointment:    Pending Cath results   The format for your next appointment:   In Person  Provider:   Dorris Carnes, MD   Other Instructions Thank you for choosing [facility]!     [address] [place] [address] Dept: [phone] Loc: [place]  01/17/2021  You are scheduled for a Cardiac Catheterization on Friday, March 18 with Dr. Daneen Schick.  1. Please arrive at the [facility] (Main Entrance A) at [facility]: [address] [place], [place] [address] at 5:30 AM (This time is two hours before your procedure to ensure your preparation). Free valet parking service is available.   Special note: Every effort is made to have your procedure done on time. Please understand that emergencies sometimes delay scheduled procedures.  2. Diet: Do not eat solid foods after midnight.  The patient may have clear liquids until 5am upon the day of the procedure.  3. Labs: You will need to have blood drawn on today. You do not need to be fasting.  4. Medication instructions in preparation for your procedure:   Contrast Allergy: No  On the morning of your procedure, take your Aspirin and any morning medicines NOT  listed above.  You may use sips of water.  5. Plan for one night stay--bring personal belongings. 6. Bring a current list of your medications and current insurance cards. 7. You MUST have a responsible person to drive you home. 8. Someone MUST be with you the first 24 hours after you arrive home or your discharge will be delayed. 9.  Please wear clothes that are easy to get on and off and wear slip-on shoes.  Thank you for allowing [place] to care for you!   -- [facility]

## 2021-01-17 NOTE — H&P (View-Only) (Signed)
Cardiology Office Note   Date:  01/17/2021   ID:  Zachary Adams, DOB 1960/12/22, MRN [id number]  PCP:  Caryl Bis, MD  Cardiologist:   Dorris Carnes, MD   Pt referred for evaluation of SOB / fatigue  By Dr Quillian Quince     History of Present Illness: Zachary Adams is a 60 y.o. male who is referred for SOB, palpitations and chest tightness   The pt says he was doing ok in Dec 2021  Very active working with horses.  .  He says starting Jan 8 he noticed he got very SOB with exertion.   Stopped and eased    Just gives out  [employer]   Works with horses  What he could do in Dec he can no longer do without giving out.    A few days later he was seen by PCP   Placed on Doxycycline for walking pneumonia.   He denies cough or fever.  Said it didn't help The pt also notes intermitt chest prssure   Last episode was 2 days ago when he was working with horses   [place] working   [employer] away The pt has had some heart racing    HR one night was 120s   However fast heart does not proceed the episodes of SOB / exhaution  The pt was seen 3/15  COmplained of the palpitations and SOB, chest tightness  Referred to cardiology .   Mother with hx of  Afib   Sister with heart valve problem   Pt has hx of smoking since teens   QUit several years ago  No hx of car ride or prolonged immobilization   No injury     No leg swelling   Current Meds  Medication Sig  . ALPRAZolam (XANAX) 0.5 MG tablet Take 0.5 mg by mouth daily. May take a second dose of 0.25 mg as needed for anxiety  . ibuprofen (ADVIL,MOTRIN) 200 MG tablet Take 800 mg by mouth 3 (three) times daily as needed for headache or moderate pain.  . Multiple Vitamin (MULTIVITAMIN) tablet Take 1 tablet by mouth daily.  . risperiDONE (RISPERDAL) 1 MG tablet Take 1 mg by mouth daily.  . simvastatin (ZOCOR) 40 MG tablet Take 40 mg by mouth at bedtime.     Allergies:   Penicillins   Past Medical History:  Diagnosis Date  . Adenomatous colon polyp 2008  .  Anxiety   . Depression   . Hyperlipemia   . IBS (irritable bowel syndrome)     Past Surgical History:  Procedure Laterality Date  . COLONOSCOPY  03/13/2007     SLF: A 5 mm sessile sigmoid adenomatous polyp removed   . COLONOSCOPY N/A 12/15/2012   Dr. Oneida Alar: multiple sessile polyps (12), mild diverticulosis in sigmoid colon, moderate internal hemorrhoids. path with serrated adenoma and hyperplastic polyps.   . COLONOSCOPY WITH PROPOFOL N/A 01/13/2018   Procedure: COLONOSCOPY WITH PROPOFOL;  Surgeon: Danie Binder, MD;  [facility];  Service: Endoscopy;  Laterality: N/A;  7:30am  . ESOPHAGOGASTRODUODENOSCOPY  03/13/2007     NOB:SJGGEZ esophagus without evidence of erosion, Barrett's or ulcers/ Normal stomach, duodenal bulb and second portion of the duodenum  . POLYPECTOMY  01/13/2018   Procedure: POLYPECTOMY;  Surgeon: Danie Binder, MD;  [facility];  Service: Endoscopy;;  ascending colon polyp     Social History:  The patient  reports that he quit smoking about 3 years ago.  His smoking use included cigars. He has a 60.00 pack-year smoking history. He has never used smokeless tobacco. He reports current alcohol use. He reports that he does not use drugs.   Family History:  The patient's family history includes Colon cancer in an other family member; Osteoporosis in his mother; Parkinsonism in his father.    ROS:  Please see the history of present illness. All other systems are reviewed and  Negative to the above problem except as noted.    PHYSICAL EXAM: VS:  BP 110/74 (BP Location: Right Arm)   Pulse 94   Ht 6\' 4"  (1.93 m)   Wt 217 lb (98.4 kg)   SpO2 96%   BMI 26.41 kg/m   GEN: Well nourished, well developed, in no acute distress  HEENT: normal  Neck: no JVD, no carotid bruits Cardiac: RRR; no murmurs  ,no  LE edema   Good pulses Respiratory:  clear to auscultation bilaterally, normal work of breathing  No wheezes or rales   GI: soft, nontender,  nondistended, + BS  No hepatomegaly  MS: no deformity Moving all extremities   Skin: warm and dry, no rash Neuro:  Strength and sensation are intact Psych: euthymic mood, full affect   EKG:  EKG is ordered today.  SR 78   bpm   Lipid Panel No results found for: CHOL, TRIG, HDL, CHOLHDL, VLDL, LDLCALC, LDLDIRECT    Wt Readings from Last 3 Encounters:  01/17/21 217 lb (98.4 kg)  01/06/18 208 lb (94.3 kg)  12/10/17 210 lb 6.4 oz (95.4 kg)      ASSESSMENT AND PLAN:  1  SOB/exhaustion/ chest tightness  Pt presents with a 2 month hx of DOE   Profound   Very active previously   No hx to sugg DVT/PE  On exam, pt is moving air well  No evid for CHF CT scan done in Sept 2021 for lung cancer screening showed calcifications of the coronary arteries    I am concerned by above presentation that the pt is have progressive angina    REviewed with him   I would recomm L heart cath to define anatomy    If no flow limiting lesions would consider R heart cath to r/o pulmonary HTN   This is much less likely. WIll get CBC, TSH today to r/o other causes      2  Palpitations   Pt notices heart going fast at times   I am not convinced of a signif arrhythmia to explain above symptoms   Follow     3   Lipids   Will check today     Current medicines are reviewed at length with the patient today.  The patient does not have concerns regarding medicines.  Signed, Dorris Carnes, MD  01/17/2021 11:19 AM    [facility], [place], [place]  [address] Phone: [phone]; Fax: [phone]

## 2021-01-17 NOTE — Progress Notes (Signed)
Cardiology Office Note   Date:  01/17/2021   ID:  Zachary Adams, DOB 27-Jun-1961, MRN [id number]  PCP:  Caryl Bis, MD  Cardiologist:   Dorris Carnes, MD   Pt referred for evaluation of SOB / fatigue  By Dr Zachary Adams     History of Present Illness: Zachary Adams is a 60 y.o. male who is referred for SOB, palpitations and chest tightness   The pt says he was doing ok in Dec 2021  Very active working with horses.  .  He says starting Jan 8 he noticed he got very SOB with exertion.   Stopped and eased    Just gives out  [employer]   Works with horses  What he could do in Dec he can no longer do without giving out.    A few days later he was seen by PCP   Placed on Doxycycline for walking pneumonia.   He denies cough or fever.  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   Mother with hx of  Afib   Sister with heart valve problem   Pt has hx of smoking since teens   QUit several years ago  No hx of car ride or prolonged immobilization   No injury     No leg swelling   Current Meds  Medication Sig  . ALPRAZolam (XANAX) 0.5 MG tablet Take 0.5 mg by mouth daily. May take a second dose of 0.25 mg as needed for anxiety  . ibuprofen (ADVIL,MOTRIN) 200 MG tablet Take 800 mg by mouth 3 (three) times daily as needed for headache or moderate pain.  . Multiple Vitamin (MULTIVITAMIN) tablet Take 1 tablet by mouth daily.  . risperiDONE (RISPERDAL) 1 MG tablet Take 1 mg by mouth daily.  . simvastatin (ZOCOR) 40 MG tablet Take 40 mg by mouth at bedtime.     Allergies:   Penicillins   Past Medical History:  Diagnosis Date  . Adenomatous colon polyp 2008  .  Anxiety   . Depression   . Hyperlipemia   . IBS (irritable bowel syndrome)     Past Surgical History:  Procedure Laterality Date  . COLONOSCOPY  03/13/2007     SLF: A 5 mm sessile sigmoid adenomatous polyp removed   . COLONOSCOPY N/A 12/15/2012   Dr. Oneida Alar: multiple sessile polyps (12), mild diverticulosis in sigmoid colon, moderate internal hemorrhoids. path with serrated adenoma and hyperplastic polyps.   . COLONOSCOPY WITH PROPOFOL N/A 01/13/2018   Procedure: COLONOSCOPY WITH PROPOFOL;  Surgeon: Danie Binder, MD;  [facility];  Service: Endoscopy;  Laterality: N/A;  7:30am  . ESOPHAGOGASTRODUODENOSCOPY  03/13/2007     SHF:WYOVZC esophagus without evidence of erosion, Barrett's or ulcers/ Normal stomach, duodenal bulb and second portion of the duodenum  . POLYPECTOMY  01/13/2018   Procedure: POLYPECTOMY;  Surgeon: Danie Binder, MD;  [facility];  Service: Endoscopy;;  ascending colon polyp     Social History:  The patient  reports that he quit smoking about 3 years ago.  His smoking use included cigars. He has a 60.00 pack-year smoking history. He has never used smokeless tobacco. He reports current alcohol use. He reports that he does not use drugs.   Family History:  The patient's family history includes Colon cancer in an other family member; Osteoporosis in his mother; Parkinsonism in his father.    ROS:  Please see the history of present illness. All other systems are reviewed and  Negative to the above problem except as noted.    PHYSICAL EXAM: VS:  BP 110/74 (BP Location: Right Arm)   Pulse 94   Ht 6\' 4"  (1.93 m)   Wt 217 lb (98.4 kg)   SpO2 96%   BMI 26.41 kg/m   GEN: Well nourished, well developed, in no acute distress  HEENT: normal  Neck: no JVD, no carotid bruits Cardiac: RRR; no murmurs  ,no  LE edema   Good pulses Respiratory:  clear to auscultation bilaterally, normal work of breathing  No wheezes or rales   GI: soft, nontender,  nondistended, + BS  No hepatomegaly  MS: no deformity Moving all extremities   Skin: warm and dry, no rash Neuro:  Strength and sensation are intact Psych: euthymic mood, full affect   EKG:  EKG is ordered today.  SR 78   bpm   Lipid Panel No results found for: CHOL, TRIG, HDL, CHOLHDL, VLDL, LDLCALC, LDLDIRECT    Wt Readings from Last 3 Encounters:  01/17/21 217 lb (98.4 kg)  01/06/18 208 lb (94.3 kg)  12/10/17 210 lb 6.4 oz (95.4 kg)      ASSESSMENT AND PLAN:  1  SOB/exhaustion/ chest tightness  Pt presents with a 2 month hx of DOE   Profound   Very active previously   No hx to sugg DVT/PE  On exam, pt is moving air well  No evid for CHF CT scan done in Sept 2021 for lung cancer screening showed calcifications of the coronary arteries    I am concerned by above presentation that the pt is have progressive angina    REviewed with him   I would recomm L heart cath to define anatomy    If no flow limiting lesions would consider R heart cath to r/o pulmonary HTN   This is much less likely. WIll get CBC, TSH today to r/o other causes      2  Palpitations   Pt notices heart going fast at times   I am not convinced of a signif arrhythmia to explain above symptoms   Follow     3   Lipids   Will check today     Current medicines are reviewed at length with the patient today.  The patient does not have concerns regarding medicines.  Signed, Dorris Carnes, MD  01/17/2021 11:19 AM    [facility], [place], [place]  [address] Phone: [phone]; Fax: [phone]

## 2021-01-18 ENCOUNTER — Telehealth: Payer: Self-pay | Admitting: *Deleted

## 2021-01-18 MED ORDER — ROSUVASTATIN CALCIUM 20 MG PO TABS: 20.0000 mg | ORAL_TABLET | Freq: Every day | ORAL | 11 refills | Status: DC

## 2021-01-18 NOTE — Telephone Encounter (Signed)
Pt notified that he will stop simvastatin and start Crestor 20 mg daily.

## 2021-01-18 NOTE — H&P (Signed)
   Chest pain, palpitations, and shortness of breath.  Has coronary calcification on 9 coronary CT.    Coronary angiography ordered.

## 2021-01-18 NOTE — Telephone Encounter (Signed)
-----   Message from Fay Records, MD sent at 01/17/2021  5:29 PM EDT ----- CBC is normal  Thyroid function is normal  Electrolytes and kidney function are normal    Lipids  Pt should be on statin   would start Crestor 20 mg   Follow up lipids and AST in 8 wks

## 2021-01-18 NOTE — Telephone Encounter (Signed)
Pt contacted pre-catheterization scheduled at [facility] for: Friday January 19, 2021 7:30 AM Verified arrival time and place: [facility]) at: 5:30 AM  No solid food after midnight prior to cath, clear liquids until 5 AM day of procedure.  AM meds can be  taken pre-cath with sips of water including: ASA 81 mg   Confirmed patient has responsible adult to drive home post procedure and be with patient first 24 hours after arriving home: yes  You are allowed ONE visitor in the waiting room during the time you are at the hospital for your procedure. Both you and your visitor must wear a mask once you enter the hospital.   Reviewed procedure/mask/visitor instructions with patient.

## 2021-01-19 ENCOUNTER — Ambulatory Visit (HOSPITAL_COMMUNITY)
Admission: RE | Admit: 2021-01-19 | Discharge: 2021-01-19 | Disposition: A | Discharge status: Home / Self Care | Payer: 59 | Attending: Interventional Cardiology | Admitting: Interventional Cardiology

## 2021-01-19 ENCOUNTER — Other Ambulatory Visit: Payer: Self-pay

## 2021-01-19 ENCOUNTER — Encounter (HOSPITAL_COMMUNITY)
Admission: RE | Disposition: A | Discharge status: Home / Self Care | Payer: Self-pay | Source: Home / Self Care | Attending: Interventional Cardiology

## 2021-01-19 DIAGNOSIS — R079 Chest pain, unspecified: Secondary | ICD-10-CM

## 2021-01-19 DIAGNOSIS — Z79899 Other long term (current) drug therapy: Secondary | ICD-10-CM | POA: Insufficient documentation

## 2021-01-19 DIAGNOSIS — Z880 Allergy status to penicillin: Secondary | ICD-10-CM | POA: Diagnosis not present

## 2021-01-19 DIAGNOSIS — R002 Palpitations: Secondary | ICD-10-CM | POA: Diagnosis not present

## 2021-01-19 DIAGNOSIS — E785 Hyperlipidemia, unspecified: Secondary | ICD-10-CM | POA: Insufficient documentation

## 2021-01-19 DIAGNOSIS — I25110 Atherosclerotic heart disease of native coronary artery with unstable angina pectoris: Secondary | ICD-10-CM | POA: Insufficient documentation

## 2021-01-19 DIAGNOSIS — R0602 Shortness of breath: Secondary | ICD-10-CM | POA: Diagnosis present

## 2021-01-19 DIAGNOSIS — Z87891 Personal history of nicotine dependence: Secondary | ICD-10-CM | POA: Insufficient documentation

## 2021-01-19 DIAGNOSIS — I2510 Atherosclerotic heart disease of native coronary artery without angina pectoris: Secondary | ICD-10-CM

## 2021-01-19 HISTORY — PX: LEFT HEART CATH AND CORONARY ANGIOGRAPHY: CATH118249

## 2021-01-19 SURGERY — LEFT HEART CATH AND CORONARY ANGIOGRAPHY
Anesthesia: LOCAL

## 2021-01-19 MED ORDER — ASPIRIN 81 MG PO CHEW: 81.0000 mg | CHEWABLE_TABLET | ORAL | Status: DC

## 2021-01-19 MED ORDER — ACETAMINOPHEN 325 MG PO TABS: 650.0000 mg | ORAL_TABLET | ORAL | Status: DC | PRN

## 2021-01-19 MED ORDER — SODIUM CHLORIDE 0.9 % IV SOLN: 250.0000 mL | INTRAVENOUS | Status: DC | PRN

## 2021-01-19 MED ORDER — LABETALOL HCL 5 MG/ML IV SOLN: 10.0000 mg | INTRAVENOUS | Status: DC | PRN

## 2021-01-19 MED ORDER — FENTANYL CITRATE (PF) 100 MCG/2ML IJ SOLN
INTRAMUSCULAR | Status: DC | PRN
  Administered 2021-01-19 (×2): 25 ug via INTRAVENOUS

## 2021-01-19 MED ORDER — HEPARIN SODIUM (PORCINE) 1000 UNIT/ML IJ SOLN
INTRAMUSCULAR | Status: DC | PRN
  Administered 2021-01-19: 5000 [IU] via INTRAVENOUS

## 2021-01-19 MED ORDER — MIDAZOLAM HCL 2 MG/2ML IJ SOLN
INTRAMUSCULAR | Status: AC
  Filled 2021-01-19: qty 2

## 2021-01-19 MED ORDER — HEPARIN (PORCINE) IN NACL 1000-0.9 UT/500ML-% IV SOLN
INTRAVENOUS | Status: DC | PRN
  Administered 2021-01-19 (×2): 500 mL

## 2021-01-19 MED ORDER — MIDAZOLAM HCL 2 MG/2ML IJ SOLN
INTRAMUSCULAR | Status: DC | PRN
  Administered 2021-01-19: 0.5 mg via INTRAVENOUS
  Administered 2021-01-19: 1 mg via INTRAVENOUS

## 2021-01-19 MED ORDER — LIDOCAINE HCL (PF) 1 % IJ SOLN
INTRAMUSCULAR | Status: AC
  Filled 2021-01-19: qty 30

## 2021-01-19 MED ORDER — SODIUM CHLORIDE 0.9 % WEIGHT BASED INFUSION: 1.0000 mL/kg/h | INTRAVENOUS | Status: DC

## 2021-01-19 MED ORDER — SODIUM CHLORIDE 0.9% FLUSH: 3.0000 mL | Freq: Two times a day (BID) | INTRAVENOUS | Status: DC

## 2021-01-19 MED ORDER — OXYCODONE HCL 5 MG PO TABS: 5.0000 mg | ORAL_TABLET | ORAL | Status: DC | PRN

## 2021-01-19 MED ORDER — LIDOCAINE HCL (PF) 1 % IJ SOLN
INTRAMUSCULAR | Status: DC | PRN
  Administered 2021-01-19: 2 mL

## 2021-01-19 MED ORDER — SODIUM CHLORIDE 0.9% FLUSH: 3.0000 mL | INTRAVENOUS | Status: DC | PRN

## 2021-01-19 MED ORDER — SODIUM CHLORIDE 0.9 % IV SOLN: INTRAVENOUS | Status: DC

## 2021-01-19 MED ORDER — IOHEXOL 350 MG/ML SOLN
INTRAVENOUS | Status: DC | PRN
  Administered 2021-01-19: 65 mL

## 2021-01-19 MED ORDER — HEPARIN SODIUM (PORCINE) 1000 UNIT/ML IJ SOLN
INTRAMUSCULAR | Status: AC
  Filled 2021-01-19: qty 1

## 2021-01-19 MED ORDER — HYDRALAZINE HCL 20 MG/ML IJ SOLN: 10.0000 mg | INTRAMUSCULAR | Status: DC | PRN

## 2021-01-19 MED ORDER — SODIUM CHLORIDE 0.9% FLUSH
3.0000 mL | INTRAVENOUS | Status: DC | PRN
Start: 2021-01-19 — End: 2021-01-19

## 2021-01-19 MED ORDER — SODIUM CHLORIDE 0.9 % WEIGHT BASED INFUSION
3.0000 mL/kg/h | INTRAVENOUS | Status: DC
  Administered 2021-01-19: 3 mL/kg/h via INTRAVENOUS

## 2021-01-19 MED ORDER — VERAPAMIL HCL 2.5 MG/ML IV SOLN
INTRAVENOUS | Status: AC
  Filled 2021-01-19: qty 2

## 2021-01-19 MED ORDER — ONDANSETRON HCL 4 MG/2ML IJ SOLN: 4.0000 mg | Freq: Four times a day (QID) | INTRAMUSCULAR | Status: DC | PRN

## 2021-01-19 MED ORDER — FENTANYL CITRATE (PF) 100 MCG/2ML IJ SOLN
INTRAMUSCULAR | Status: AC
  Filled 2021-01-19: qty 2

## 2021-01-19 MED ORDER — VERAPAMIL HCL 2.5 MG/ML IV SOLN
INTRAVENOUS | Status: DC | PRN
  Administered 2021-01-19: 10 mL via INTRA_ARTERIAL

## 2021-01-19 MED ORDER — HEPARIN (PORCINE) IN NACL 1000-0.9 UT/500ML-% IV SOLN
INTRAVENOUS | Status: AC
  Filled 2021-01-19: qty 1000

## 2021-01-19 SURGICAL SUPPLY — 11 items
BAG SNAP BAND KOVER 36X36 (MISCELLANEOUS) ×1 IMPLANT
CATH 5FR JL3.5 JR4 ANG PIG MP (CATHETERS) ×1 IMPLANT
DEVICE RAD COMP TR BAND LRG (VASCULAR PRODUCTS) ×1 IMPLANT
GLIDESHEATH SLEND A-KIT 6F 22G (SHEATH) ×1 IMPLANT
GUIDEWIRE INQWIRE 1.5J.035X260 (WIRE) IMPLANT
INQWIRE 1.5J .035X260CM (WIRE) ×2
KIT HEART LEFT (KITS) ×2 IMPLANT
PACK CARDIAC CATHETERIZATION (CUSTOM PROCEDURE TRAY) ×2 IMPLANT
SHEATH PROBE COVER 6X72 (BAG) ×1 IMPLANT
TRANSDUCER W/STOPCOCK (MISCELLANEOUS) ×2 IMPLANT
TUBING CIL FLEX 10 FLL-RA (TUBING) ×2 IMPLANT

## 2021-01-19 NOTE — Interval H&P Note (Signed)
Cath Lab Visit (complete for each Cath Lab visit)  Clinical Evaluation Leading to the Procedure:   ACS: No.  Non-ACS:    Anginal Classification: CCS III  Anti-ischemic medical therapy: Minimal Therapy (1 class of medications)  Non-Invasive Test Results: No non-invasive testing performed  Prior CABG: No previous CABG      History and Physical Interval Note:  01/19/2021 7:28 AM  Zachary Adams  has presented today for surgery, with the diagnosis of chest pressure - sob.  The various methods of treatment have been discussed with the patient and family. After consideration of risks, benefits and other options for treatment, the patient has consented to  Procedure(s): LEFT HEART CATH AND CORONARY ANGIOGRAPHY (N/A) as a surgical intervention.  The patient's history has been reviewed, patient examined, no change in status, stable for surgery.  I have reviewed the patient's chart and labs.  Questions were answered to the patient's satisfaction.     Belva Crome III

## 2021-01-19 NOTE — CV Procedure (Signed)
   30% proximal to mid circumflex  Otherwise normal coronaries  Normal left ventricular function and LVEDP less than 12 mmHg.

## 2021-01-19 NOTE — Discharge Instructions (Signed)
Radial Site Care  This sheet gives you information about how to care for yourself after your procedure. Your health care provider may also give you more specific instructions. If you have problems or questions, contact your health care provider. What can I expect after the procedure? After the procedure, it is common to have:  Bruising and tenderness at the catheter insertion area. Follow these instructions at home: Medicines  Take over-the-counter and prescription medicines only as told by your health care provider. Insertion site care  Follow instructions from your health care provider about how to take care of your insertion site. Make sure you: ? Wash your hands with soap and water before you change your bandage (dressing). If soap and water are not available, use hand sanitizer. ? Change your dressing as told by your health care provider. ? Leave stitches (sutures), skin glue, or adhesive strips in place. These skin closures may need to stay in place for 2 weeks or longer. If adhesive strip edges start to loosen and curl up, you may trim the loose edges. Do not remove adhesive strips completely unless your health care provider tells you to do that.  Check your insertion site every day for signs of infection. Check for: ? Redness, swelling, or pain. ? Fluid or blood. ? Pus or a bad smell. ? Warmth.  Do not take baths, swim, or use a hot tub until your health care provider approves.  You may shower 24-48 hours after the procedure, or as directed by your health care provider. ? Remove the dressing and gently wash the site with plain soap and water. ? Pat the area dry with a clean towel. ? Do not rub the site. That could cause bleeding.  Do not apply powder or lotion to the site. Activity  For 24 hours after the procedure, or as directed by your health care provider: ? Do not flex or bend the affected arm. ? Do not push or pull heavy objects with the affected arm. ? Do not drive  yourself home from the hospital or clinic. You may drive 24 hours after the procedure unless your health care provider tells you not to. ? Do not operate machinery or power tools.  Do not lift anything that is heavier than 10 lb (4.5 kg), or the limit that you are told, until your health care provider says that it is safe.  Ask your health care provider when it is okay to: ? Return to work or school. ? Resume usual physical activities or sports. ? Resume sexual activity.   General instructions  If the catheter site starts to bleed, raise your arm and put firm pressure on the site. If the bleeding does not stop, get help right away. This is a medical emergency.  If you went home on the same day as your procedure, a responsible adult should be with you for the first 24 hours after you arrive home.  Keep all follow-up visits as told by your health care provider. This is important. Contact a health care provider if:  You have a fever.  You have redness, swelling, or yellow drainage around your insertion site. Get help right away if:  You have unusual pain at the radial site.  The catheter insertion area swells very fast.  The insertion area is bleeding, and the bleeding does not stop when you hold steady pressure on the area.  Your arm or hand becomes pale, cool, tingly, or numb. These symptoms may represent a serious   problem that is an emergency. Do not wait to see if the symptoms will go away. Get medical help right away. Call your local emergency services (911 in the [place] Do not drive yourself to the hospital. Summary  After the procedure, it is common to have bruising and tenderness at the site.  Follow instructions from your health care provider about how to take care of your radial site wound. Check the wound every day for signs of infection.  Do not lift anything that is heavier than 10 lb (4.5 kg), or the limit that you are told, until your health care provider says that it  is safe. This information is not intended to replace advice given to you by your health care provider. Make sure you discuss any questions you have with your health care provider. Document Revised: 11/26/2017 Document Reviewed: 11/26/2017 Elsevier Patient Education  2021 [employer]  

## 2021-01-22 ENCOUNTER — Encounter (HOSPITAL_COMMUNITY): Payer: Self-pay | Admitting: Interventional Cardiology

## 2021-01-25 NOTE — Progress Notes (Unsigned)
Cardiology Office Note   Date:  01/26/2021   ID:  Zachary Adams, DOB Nov 22, 1960, MRN [id number]  PCP:  Caryl Bis, MD  Cardiologist:   Dorris Carnes, MD   Pt presents for f/u of SOB      History of Present Illness: Zachary Adams is a 61 y.o. male who was referred for SOB, palpitations and chest tightness  I saw him a few wks ago  Works with horses  Says he was doing OK until Jan 2022   Got very SOB with exertion.   Placed on abx for 'walking pneumonia"  Didn't help   When I saw him I reviewed records  Pt had CT of chest (cancer screening ) that showed CAD  Given Hx and this I recomm L heart cath    He did not give a hx suspicious for PE and sats OK  The pt had the L heart cath done on 01/19/21 by Linard Millers  This showe 30% L Cx   LVEF normal  LVEDP 12 mm Hg  Since cath he remains SOB with activity   He denies CP   Does not think that he snores  Denies wheezing  Current Meds  Medication Sig  . acetaminophen (TYLENOL) 650 MG CR tablet Take 650 mg by mouth every 8 (eight) hours as needed for pain.  Marland Kitchen ALPRAZolam (XANAX) 0.5 MG tablet Take 0.5 mg by mouth 3 (three) times daily as needed for anxiety.  Marland Kitchen ibuprofen (ADVIL,MOTRIN) 200 MG tablet Take 800 mg by mouth 3 (three) times daily as needed for headache or moderate pain.  . mirtazapine (REMERON) 30 MG tablet Take 30 mg by mouth at bedtime.  . Multiple Vitamin (MULTIVITAMIN) tablet Take 1 tablet by mouth daily.  . risperiDONE (RISPERDAL) 1 MG tablet Take 1 mg by mouth daily.  . rosuvastatin (CRESTOR) 20 MG tablet Take 1 tablet (20 mg total) by mouth daily.     Allergies:   Other, Penicillins, and Fish allergy   Past Medical History:  Diagnosis Date  . Adenomatous colon polyp 2008  . Anxiety   . Depression   . Hyperlipemia   . IBS (irritable bowel syndrome)     Past Surgical History:  Procedure Laterality Date  . COLONOSCOPY  03/13/2007     SLF: A 5 mm sessile sigmoid adenomatous polyp removed   . COLONOSCOPY N/A  12/15/2012   Dr. Oneida Alar: multiple sessile polyps (12), mild diverticulosis in sigmoid colon, moderate internal hemorrhoids. path with serrated adenoma and hyperplastic polyps.   . COLONOSCOPY WITH PROPOFOL N/A 01/13/2018   Procedure: COLONOSCOPY WITH PROPOFOL;  Surgeon: Danie Binder, MD;  [facility];  Service: Endoscopy;  Laterality: N/A;  7:30am  . ESOPHAGOGASTRODUODENOSCOPY  03/13/2007     QPY:[id number] esophagus without evidence of erosion, Barrett's or ulcers/ Normal stomach, duodenal bulb and second portion of the duodenum  . LEFT HEART CATH AND CORONARY ANGIOGRAPHY N/A 01/19/2021   Procedure: LEFT HEART CATH AND CORONARY ANGIOGRAPHY;  Surgeon: Belva Crome, MD;  Location: [facility];  Service: Cardiovascular;  Laterality: N/A;  . POLYPECTOMY  01/13/2018   Procedure: POLYPECTOMY;  Surgeon: Danie Binder, MD;  [facility]: [facility];  Service: Endoscopy;;  ascending colon polyp     Social History:  The patient  reports that he quit smoking about 3 years ago. His smoking use included cigars. He has a 60.00 pack-year smoking history. He has never used smokeless tobacco. He reports current alcohol  use. He reports that he does not use drugs.   Family History:  The patient's family history includes Colon cancer in an other family member; Osteoporosis in his mother; Parkinsonism in his father.    ROS:  Please see the history of present illness. All other systems are reviewed and  Negative to the above problem except as noted.    PHYSICAL EXAM: VS:  BP 122/70 (BP Location: Right Arm, Patient Position: Sitting, Cuff Size: Normal)   Pulse 92   Ht 6\' 4"  (1.93 m)   Wt 200 lb (90.7 kg)   SpO2 97%   BMI 24.34 kg/m   GEN: Well nourished, well developed, in no acute distress  HEENT: normal  Neck: no JVD, no carotid bruit Cardiac: RRR; no murmurs  ,no  LE edema   Good pulses  R wrist with mild bruising   Respiratory:  clear to auscultation bilaterally, normal work of  breathing  No wheezes or rales   GI: soft, nontender, nondistended, + BS  No hepatomegaly  MS: no deformity Moving all extremities   Skin: warm and dry, no rash Neuro:  Strength and sensation are intact Psych: euthymic mood, full affect   EKG:  EKG is not ordered today.   Lipid Panel    Component Value Date/Time   CHOL 155 01/17/2021 1227   TRIG 63 01/17/2021 1227   HDL 41 01/17/2021 1227   CHOLHDL 3.8 01/17/2021 1227   VLDL 13 01/17/2021 1227   LDLCALC 101 (H) 01/17/2021 1227      Wt Readings from Last 3 Encounters:  01/26/21 200 lb (90.7 kg)  01/19/21 215 lb (97.5 kg)  01/17/21 217 lb (98.4 kg)      ASSESSMENT AND PLAN:  1  SOB/exhaustion/ chest tightness Since I saw the pt he has gone on to have a L heart cath   This showed minimal dz     LVEDP was normal  R heart cath was not done The pt says he still gets SOB with activity     CT in Sept 2021 did some some emphysema  WIll get Echo to get est of PA pressures   ALso set up for PFTs    2  Palpitations   PT denies    3   Lipids   Lpids not bad  Will review  HOld   Current medicines are reviewed at length with the patient today.  The patient does not have concerns regarding medicines.  Signed, Dorris Carnes, MD  01/26/2021 8:32 AM    [address], [place], [place]  [address] Phone: [phone]; Fax: [phone]

## 2021-01-26 ENCOUNTER — Ambulatory Visit (INDEPENDENT_AMBULATORY_CARE_PROVIDER_SITE_OTHER): Payer: 59 | Admitting: Internal Medicine

## 2021-01-26 ENCOUNTER — Other Ambulatory Visit: Payer: Self-pay

## 2021-01-26 ENCOUNTER — Encounter: Payer: Self-pay | Admitting: Internal Medicine

## 2021-01-26 VITALS — BP 122/70 | HR 92 | Ht 76.0 in | Wt 200.0 lb

## 2021-01-26 DIAGNOSIS — R0602 Shortness of breath: Secondary | ICD-10-CM

## 2021-01-26 NOTE — Patient Instructions (Signed)
Medication Instructions:  Your physician recommends that you continue on your current medications as directed. Please refer to the Current Medication list given to you today.  *If you need a refill on your cardiac medications before your next appointment, please call your pharmacy*   Lab Work: NONE   If you have labs (blood work) drawn today and your tests are completely normal, you will receive your results only by: Marland Kitchen MyChart Message (if you have MyChart) OR . A paper copy in the mail If you have any lab test that is abnormal or we need to change your treatment, we will call you to review the results.   Testing/Procedures: Your physician has requested that you have an echocardiogram. Echocardiography is a painless test that uses sound waves to create images of your heart. It provides your doctor with information about the size and shape of your heart and how well your heart's chambers and valves are working. This procedure takes approximately one hour. There are no restrictions for this procedure.  Your physician has recommended that you have a pulmonary function test. Pulmonary Function Tests are a group of tests that measure how well air moves in and out of your lungs.  You will need covid testing before having you Pulmonary Function Test   Follow-Up: At [facility], you and your health needs are our priority.  As part of our continuing mission to provide you with exceptional heart care, we have created designated Provider Care Teams.  These Care Teams include your primary Cardiologist (physician) and Advanced Practice Providers (APPs -  Physician Assistants and Nurse Practitioners) who all work together to provide you with the care you need, when you need it.  We recommend signing up for the patient portal called "MyChart".  Sign up information is provided on this After Visit Summary.  MyChart is used to connect with patients for Virtual Visits (Telemedicine).  Patients are able to  view lab/test results, encounter notes, upcoming appointments, etc.  Non-urgent messages can be sent to your provider as well.   To learn more about what you can do with MyChart, go to [email or web address].    Your next appointment:    Pending test results   The format for your next appointment:   In Person  Provider:   Dorris Carnes, MD   Other Instructions Thank you for choosing [facility]!

## 2021-01-31 ENCOUNTER — Other Ambulatory Visit: Payer: Self-pay

## 2021-01-31 ENCOUNTER — Other Ambulatory Visit (HOSPITAL_COMMUNITY)
Admission: RE | Admit: 2021-01-31 | Discharge: 2021-01-31 | Disposition: A | Discharge status: Home / Self Care | Payer: 59 | Source: Ambulatory Visit | Attending: Internal Medicine | Admitting: Internal Medicine

## 2021-01-31 DIAGNOSIS — Z20822 Contact with and (suspected) exposure to covid-19: Secondary | ICD-10-CM | POA: Insufficient documentation

## 2021-01-31 DIAGNOSIS — Z01812 Encounter for preprocedural laboratory examination: Secondary | ICD-10-CM | POA: Insufficient documentation

## 2021-02-01 ENCOUNTER — Other Ambulatory Visit (HOSPITAL_COMMUNITY): Payer: Self-pay | Admitting: Family Medicine

## 2021-02-01 DIAGNOSIS — K7689 Other specified diseases of liver: Secondary | ICD-10-CM

## 2021-02-01 LAB — SARS CORONAVIRUS 2 (TAT 6-24 HRS): SARS Coronavirus 2: NEGATIVE

## 2021-02-02 ENCOUNTER — Other Ambulatory Visit: Payer: Self-pay

## 2021-02-02 ENCOUNTER — Ambulatory Visit (HOSPITAL_COMMUNITY)
Admission: RE | Admit: 2021-02-02 | Discharge: 2021-02-02 | Disposition: A | Discharge status: Home / Self Care | Payer: 59 | Source: Ambulatory Visit | Attending: Internal Medicine | Admitting: Internal Medicine

## 2021-02-02 DIAGNOSIS — R0602 Shortness of breath: Secondary | ICD-10-CM | POA: Diagnosis not present

## 2021-02-02 LAB — PULMONARY FUNCTION TEST
DL/VA % pred: 88 %
DL/VA: 3.63 ml/min/mmHg/L
DLCO cor % pred: 90 %
DLCO cor: 29.53 ml/min/mmHg
DLCO unc % pred: 91 %
DLCO unc: 29.94 ml/min/mmHg
FEF 25-75 Post: 2.62 L/sec
FEF 25-75 Pre: 2.34 L/sec
FEF2575-%Change-Post: 11 %
FEF2575-%Pred-Post: 74 %
FEF2575-%Pred-Pre: 66 %
FEV1-%Change-Post: 4 %
FEV1-%Pred-Post: 85 %
FEV1-%Pred-Pre: 82 %
FEV1-Post: 3.73 L
FEV1-Pre: 3.59 L
FEV1FVC-%Change-Post: 3 %
FEV1FVC-%Pred-Pre: 90 %
FEV6-%Change-Post: 2 %
FEV6-%Pred-Post: 92 %
FEV6-%Pred-Pre: 90 %
FEV6-Post: 5.13 L
FEV6-Pre: 5 L
FEV6FVC-%Change-Post: 2 %
FEV6FVC-%Pred-Post: 101 %
FEV6FVC-%Pred-Pre: 99 %
FVC-%Change-Post: 0 %
FVC-%Pred-Post: 91 %
FVC-%Pred-Pre: 90 %
FVC-Post: 5.28 L
FVC-Pre: 5.27 L
Post FEV1/FVC ratio: 71 %
Post FEV6/FVC ratio: 97 %
Pre FEV1/FVC ratio: 68 %
Pre FEV6/FVC Ratio: 95 %
RV % pred: 112 %
RV: 2.92 L
TLC % pred: 103 %
TLC: 8.46 L

## 2021-02-02 MED ORDER — ALBUTEROL SULFATE (2.5 MG/3ML) 0.083% IN NEBU
2.5000 mg | INHALATION_SOLUTION | Freq: Once | RESPIRATORY_TRACT | Status: AC
  Administered 2021-02-02: 2.5 mg via RESPIRATORY_TRACT

## 2021-02-08 ENCOUNTER — Ambulatory Visit (HOSPITAL_COMMUNITY)
Admission: RE | Admit: 2021-02-08 | Discharge: 2021-02-08 | Disposition: A | Discharge status: Home / Self Care | Payer: 59 | Source: Ambulatory Visit | Attending: Family Medicine | Admitting: Family Medicine

## 2021-02-08 DIAGNOSIS — K7689 Other specified diseases of liver: Secondary | ICD-10-CM | POA: Diagnosis not present

## 2021-02-13 ENCOUNTER — Other Ambulatory Visit: Payer: Self-pay

## 2021-02-13 ENCOUNTER — Ambulatory Visit (HOSPITAL_COMMUNITY)
Admission: RE | Admit: 2021-02-13 | Discharge: 2021-02-13 | Disposition: A | Discharge status: Home / Self Care | Payer: 59 | Source: Ambulatory Visit | Attending: Internal Medicine | Admitting: Internal Medicine

## 2021-02-13 DIAGNOSIS — R0602 Shortness of breath: Secondary | ICD-10-CM | POA: Diagnosis not present

## 2021-02-13 LAB — ECHOCARDIOGRAM COMPLETE
AR max vel: 2.7 cm2
AV Area VTI: 3.33 cm2
AV Area mean vel: 2.55 cm2
AV Mean grad: 3.9 mmHg
AV Peak grad: 7.5 mmHg
Ao pk vel: 1.37 m/s
Area-P 1/2: 3.24 cm2
S' Lateral: 2.6 cm

## 2021-02-13 NOTE — Progress Notes (Signed)
*  PRELIMINARY RESULTS* Echocardiogram 2D Echocardiogram has been performed.  Zachary Adams 02/13/2021, 2:51 PM

## 2021-03-02 ENCOUNTER — Telehealth: Payer: Self-pay | Admitting: Internal Medicine

## 2021-03-02 NOTE — Telephone Encounter (Signed)
Pt stated that he has seen his PCP who told him to hold his nose and lean his head back until the bleeding stops and if it does not stop, then he should go to the ER. I supported what his PCP has stated and reiterated that to the pt. Pt verbalized understanding.

## 2021-03-02 NOTE — Telephone Encounter (Signed)
New message     Patient has been having nose bleeds from the last 3 days , would like to speak with a nurse

## 2021-05-14 ENCOUNTER — Telehealth: Payer: Self-pay | Admitting: Internal Medicine

## 2021-05-14 DIAGNOSIS — R0602 Shortness of breath: Secondary | ICD-10-CM

## 2021-05-14 NOTE — Telephone Encounter (Signed)
Pt had cath and echo earlier this spring   Both normal   PFTs normal Is he still SOB with activity    If he is, would recomm a cardiopulmonary stress test to see if with activity there is an abnormality.

## 2021-05-15 NOTE — Telephone Encounter (Signed)
Pt reports that he is still SOB with activity. Order placed for cardiopulmonary stress test.

## 2021-05-15 NOTE — Addendum Note (Signed)
Addended by: Levonne Hubert on: 05/15/2021 09:55 AM   Modules accepted: Orders

## 2021-05-15 NOTE — Telephone Encounter (Signed)
Error

## 2021-05-15 NOTE — Telephone Encounter (Signed)
error 

## 2021-05-31 ENCOUNTER — Ambulatory Visit (HOSPITAL_COMMUNITY): Payer: 59 | Attending: Internal Medicine

## 2021-05-31 ENCOUNTER — Other Ambulatory Visit: Payer: Self-pay

## 2021-05-31 DIAGNOSIS — R0602 Shortness of breath: Secondary | ICD-10-CM

## 2021-09-05 ENCOUNTER — Other Ambulatory Visit: Payer: Self-pay | Admitting: Family Medicine

## 2021-09-05 ENCOUNTER — Other Ambulatory Visit (HOSPITAL_COMMUNITY): Payer: Self-pay | Admitting: Family Medicine

## 2021-09-05 DIAGNOSIS — Z87891 Personal history of nicotine dependence: Secondary | ICD-10-CM

## 2021-10-11 ENCOUNTER — Ambulatory Visit (HOSPITAL_COMMUNITY)
Admission: RE | Admit: 2021-10-11 | Discharge: 2021-10-11 | Disposition: A | Discharge status: Home / Self Care | Payer: 59 | Source: Ambulatory Visit | Attending: Family Medicine | Admitting: Family Medicine

## 2021-10-11 ENCOUNTER — Other Ambulatory Visit: Payer: Self-pay

## 2021-10-11 DIAGNOSIS — Z87891 Personal history of nicotine dependence: Secondary | ICD-10-CM | POA: Insufficient documentation

## 2021-10-15 IMAGING — US US ABDOMEN LIMITED RUQ/ASCITES
1 series · 14 of 25 positions shown · non-contrast
Comparison: Right upper quadrant ultrasound dated 08/09/2020.

CLINICAL DATA: 60-year-old male with right upper quadrant abdominal
pain.

EXAM:
ULTRASOUND ABDOMEN LIMITED RIGHT UPPER QUADRANT

[Series 1: us abdomen limited ruq (liver/gb) · 14 of 61 slices shown]
[im 1/61]
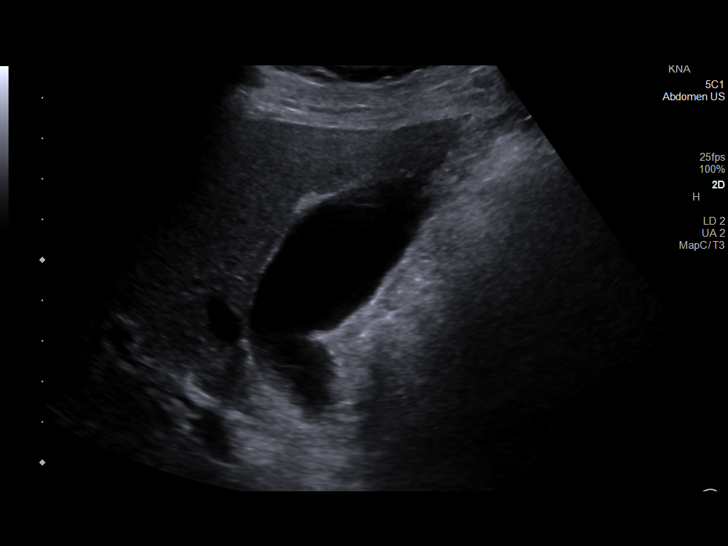
[im 6/61]
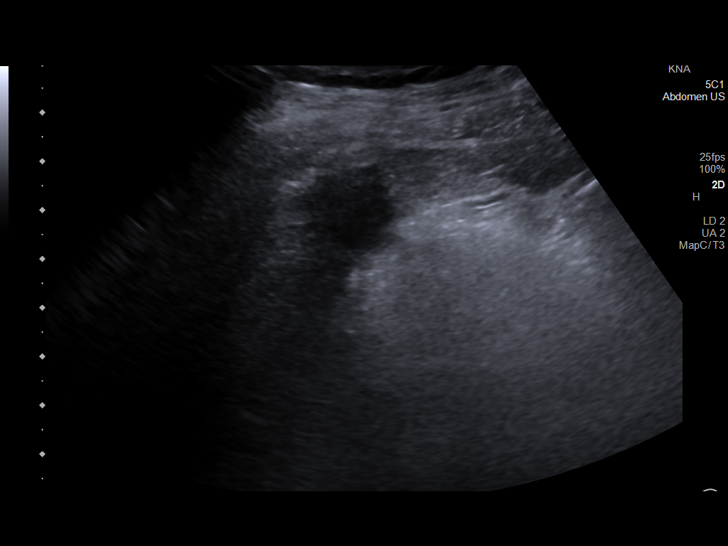
[im 11/61]
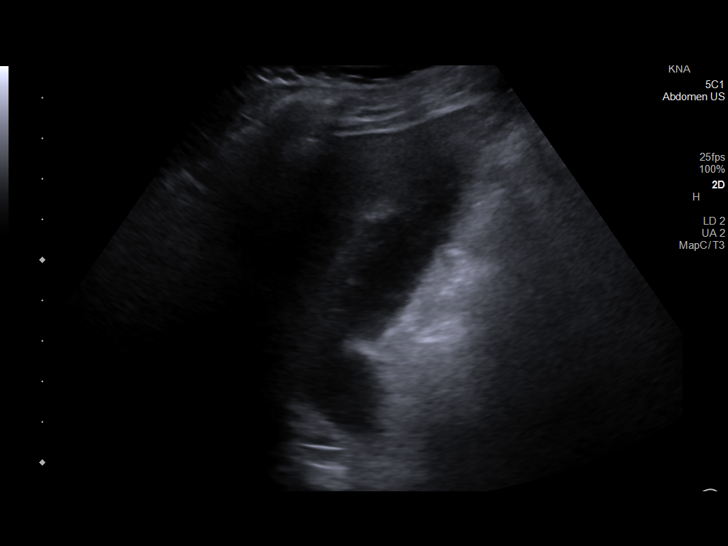
[im 16/61]
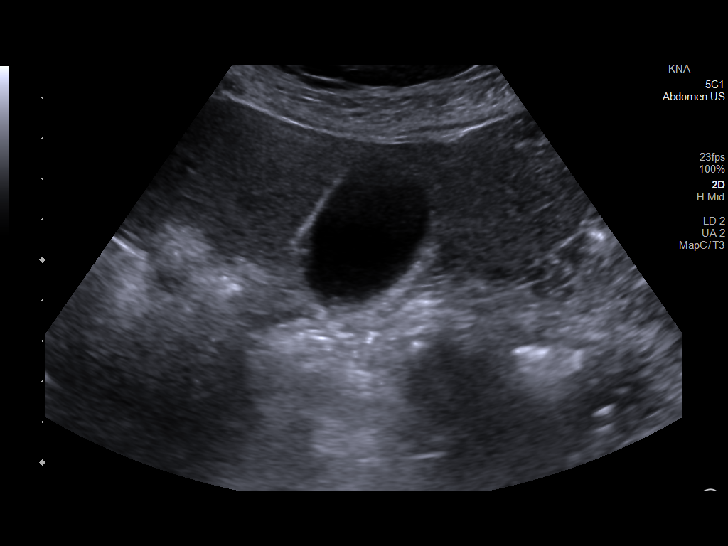
[im 21/61]
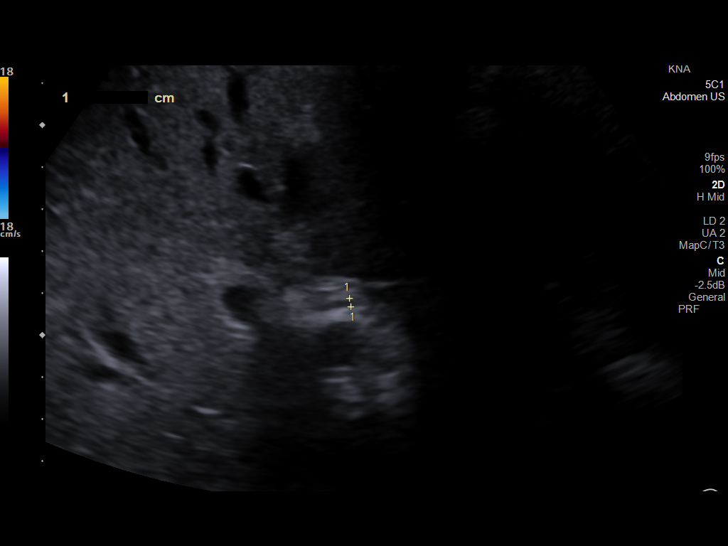
[im 23/61]
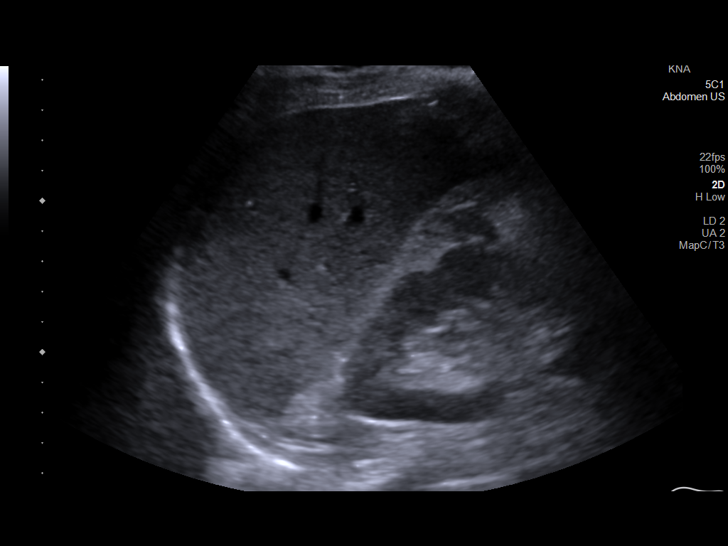
[im 28/61]
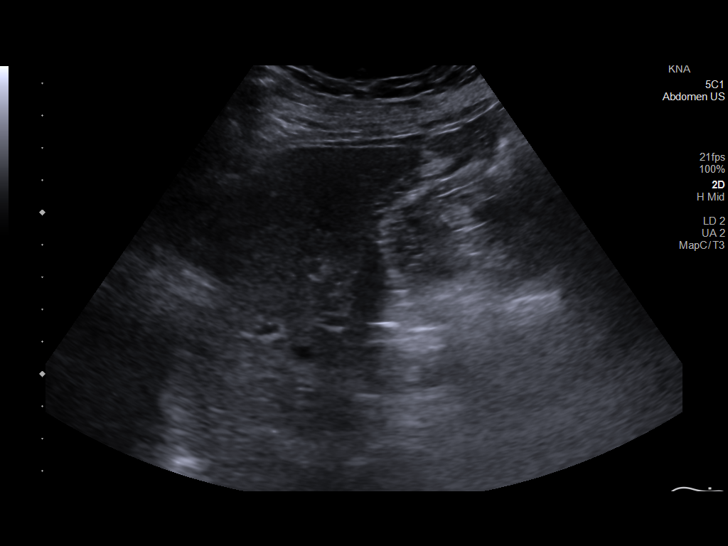
[im 33/61]
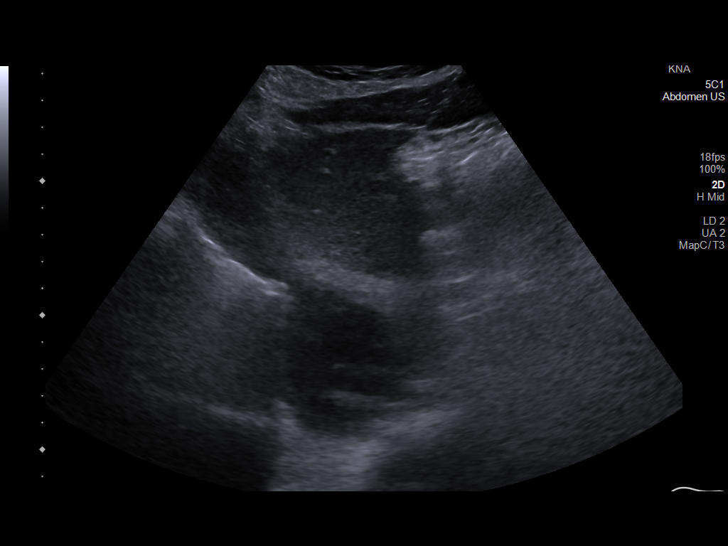
[im 38/61]
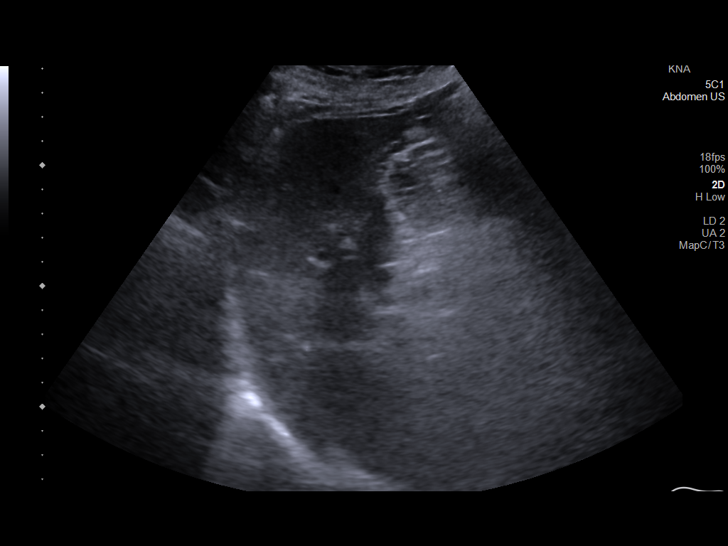
[im 41/61]
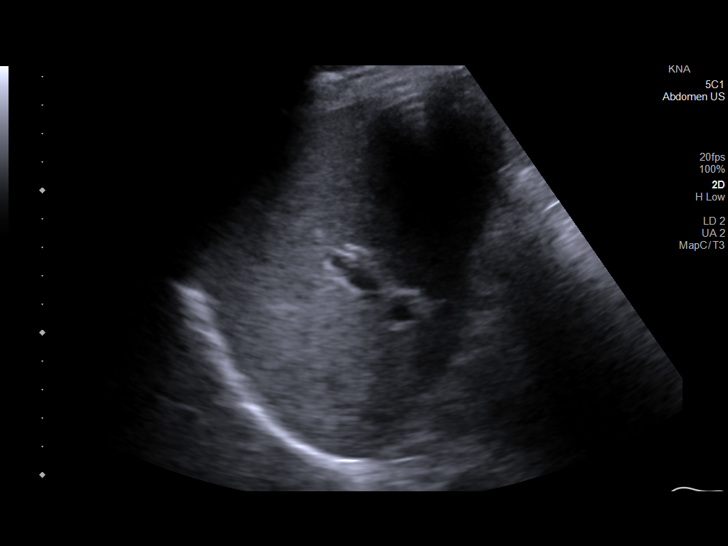
[im 46/61]
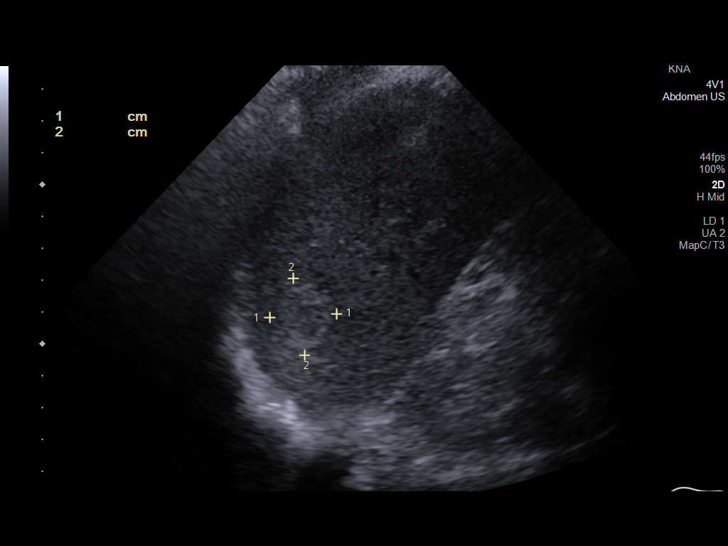
[im 51/61]
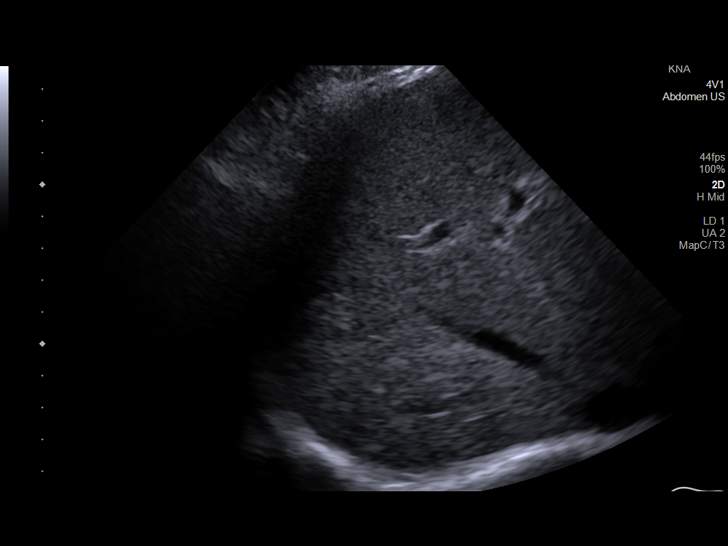
[im 56/61]
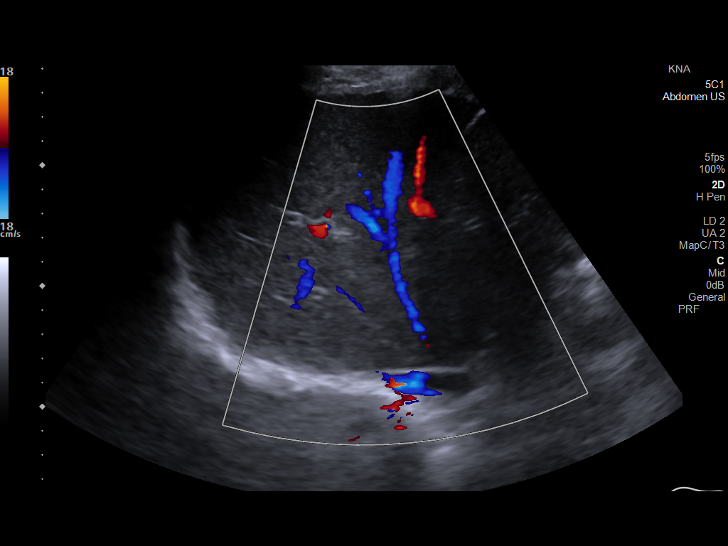
[im 61/61]
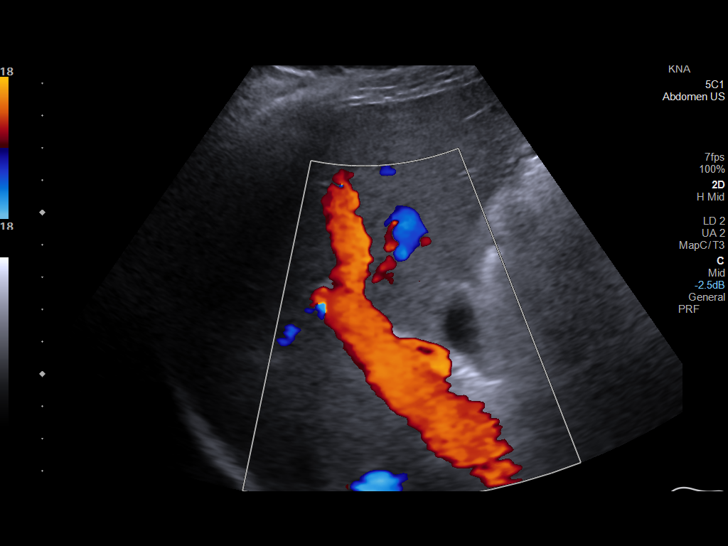

[14 of 25 positions shown; findings below may reference images not displayed]

FINDINGS: Gallbladder:

No gallstones or wall thickening visualized. No sonographic Murphy
sign noted by sonographer.

Common bile duct:

Diameter: 2 mm

Liver:

The liver demonstrates a normal echogenicity. There is a 2.1 x 2.4 x
1.8 cm hyperechoic lesion in the right lobe of the liver which is
not characterized but sonographic findings most suggestive of a
hemangioma. This is similar to the prior ultrasound of 1712. portal
vein is patent on color Doppler imaging with normal direction of
blood flow towards the liver.

Other: None.
IMPRESSION: No significant interval change in the echogenic lesion in the right
lobe of the liver compared to prior ultrasound most suggestive of a
hemangioma. Follow-up as clinically indicated.

## 2022-06-17 IMAGING — CT CT CHEST LUNG CANCER SCREENING LOW DOSE W/O CM
2 of 4 series · 15 of 36 positions shown, 18 images · non-contrast
Comparison: Low-dose lung cancer screening chest CT 08/02/2020.

CLINICAL DATA: 60-year-old male former smoker (quit 2 years ago)
with 38 pack-year history of smoking.

EXAM:
CT CHEST WITHOUT CONTRAST LOW-DOSE FOR LUNG CANCER SCREENING
TECHNIQUE: Multidetector CT imaging of the chest was performed following the
standard protocol without IV contrast.

[Series 4: lungs · axial · 0.69mm/px · z∈[+1259,+1551]mm · 12 of 322 slices shown, 15 images]
[im 15/322  mediastinal]
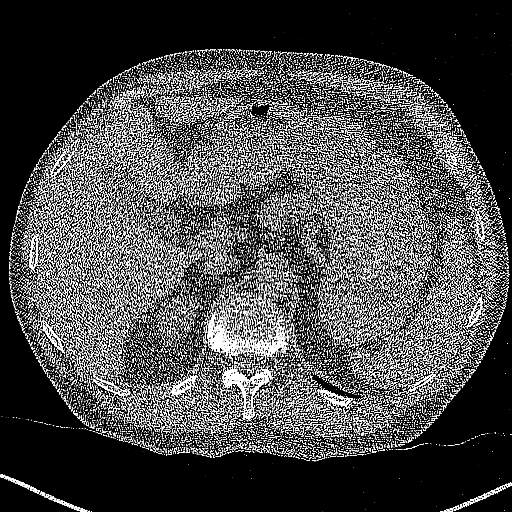
[im 15/322  lung]
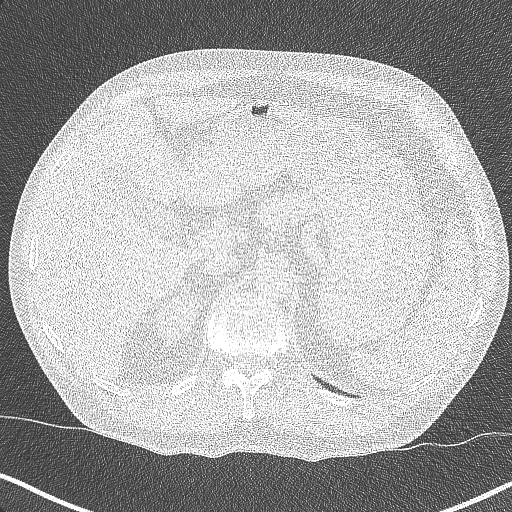
[im 44/322  lung]
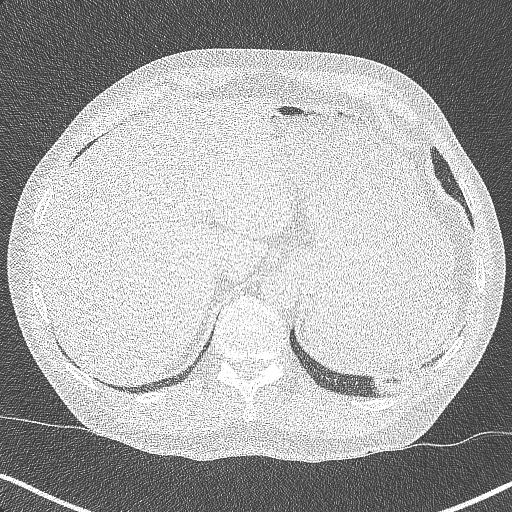
[im 73/322  lung]
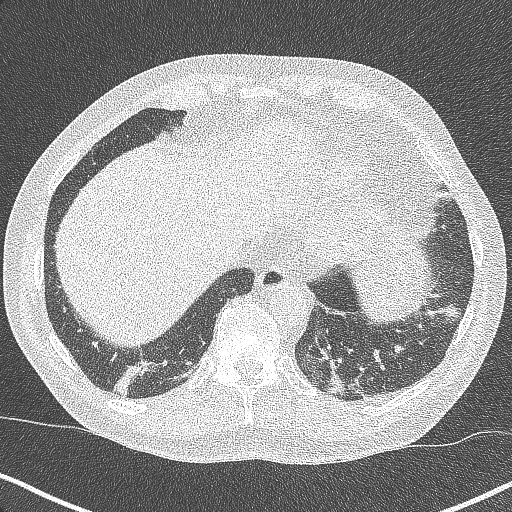
[im 103/322  lung]
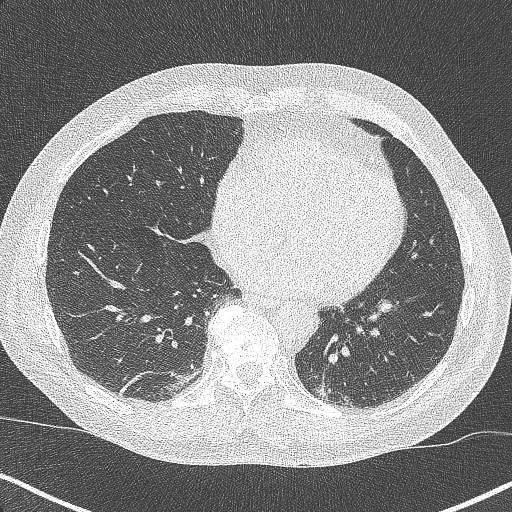
[im 117/322  mediastinal]
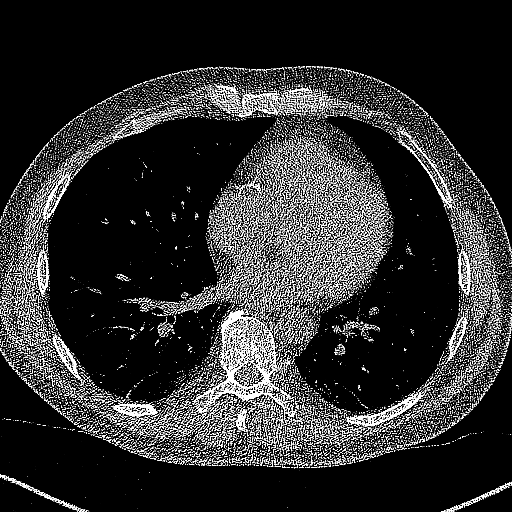
[im 117/322  lung]
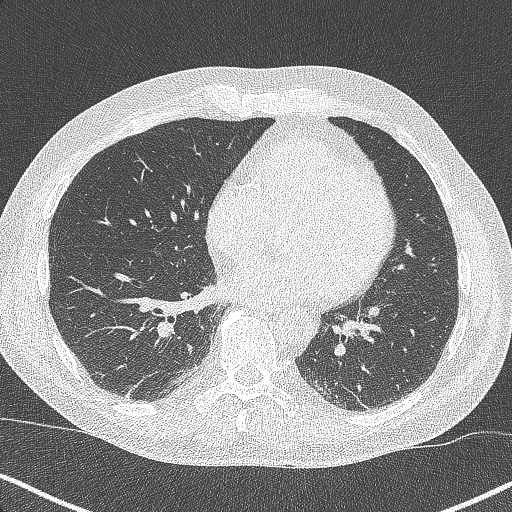
[im 146/322  lung]
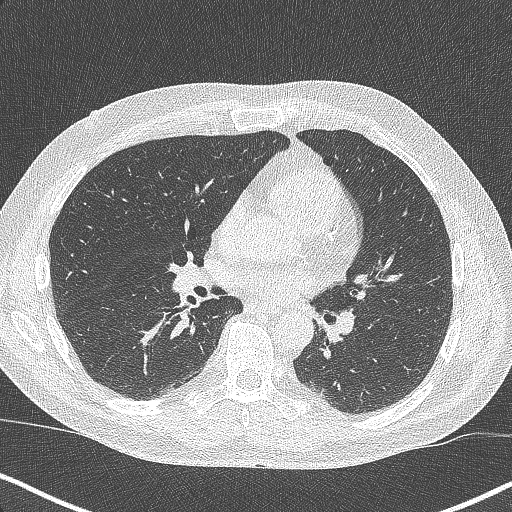
[im 176/322  lung]
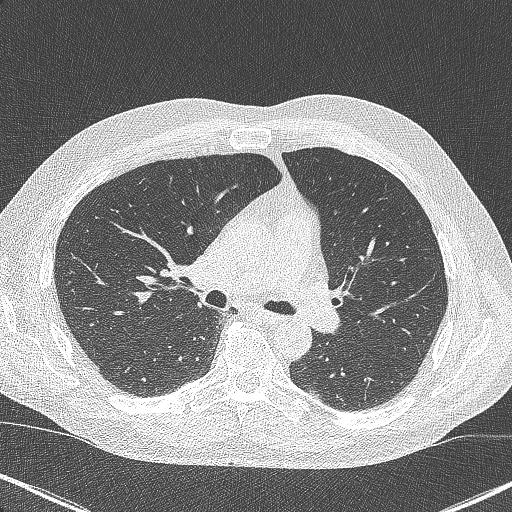
[im 205/322  lung]
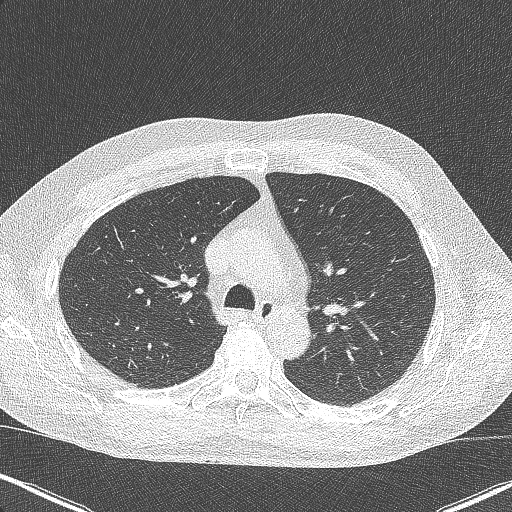
[im 219/322  mediastinal]
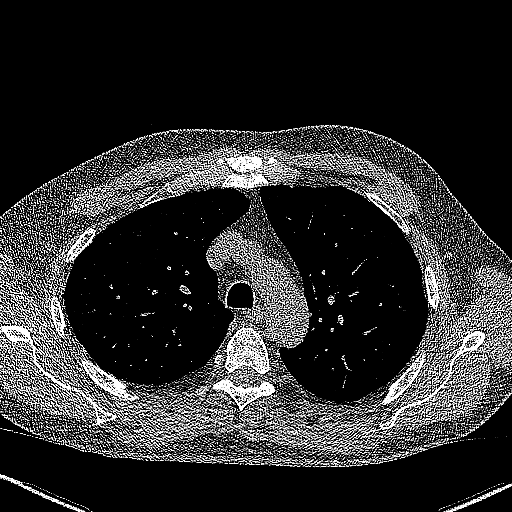
[im 219/322  lung]
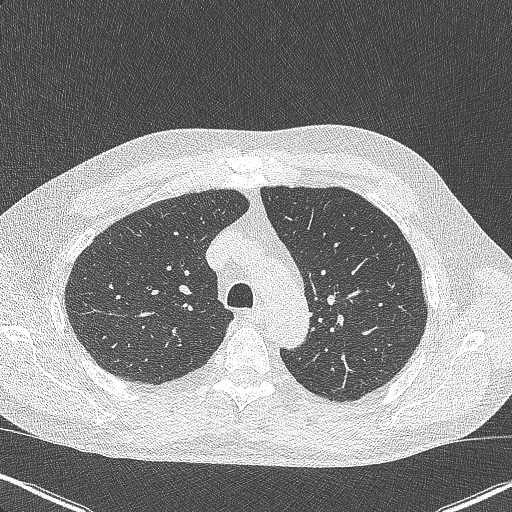
[im 249/322  lung]
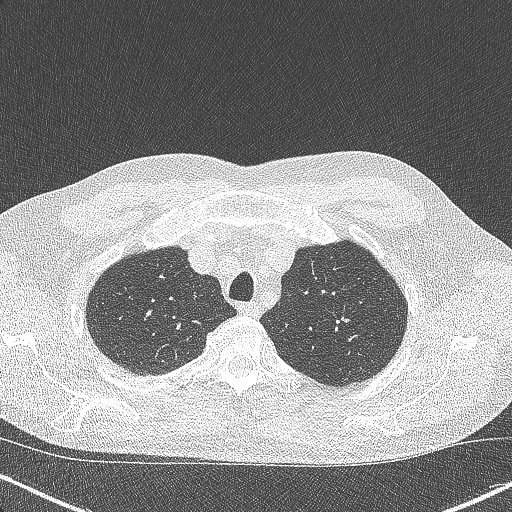
[im 278/322  lung]
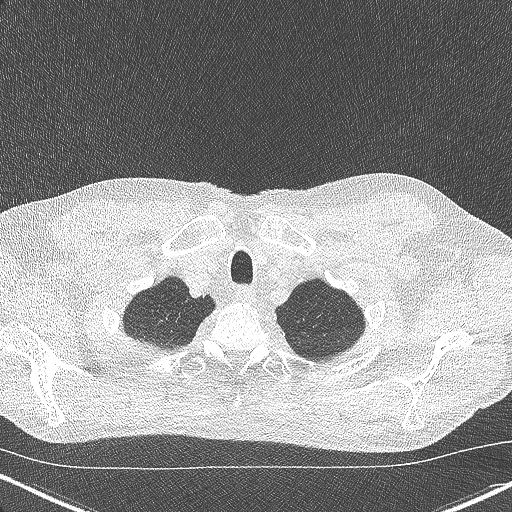
[im 307/322  lung]
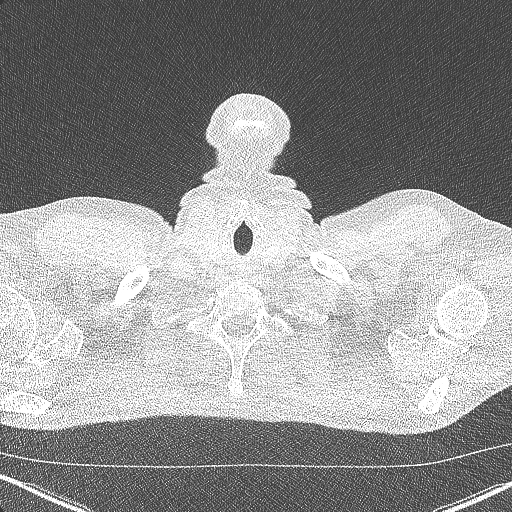

[Series 5: coronal · coronal · 0.63mm/px · 3 of 299 slices shown]
[im 60/299  lung]
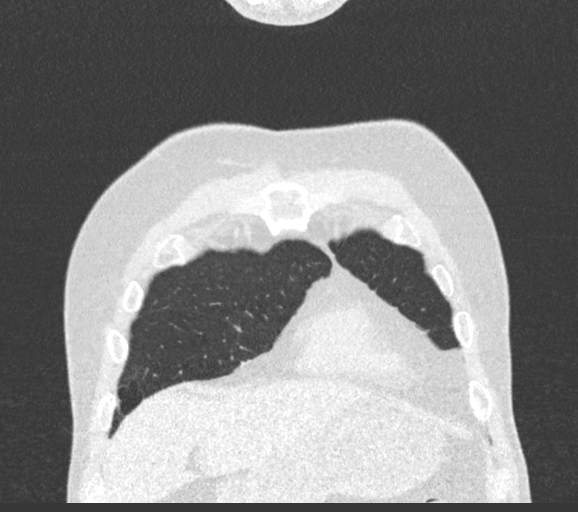
[im 120/299  lung]
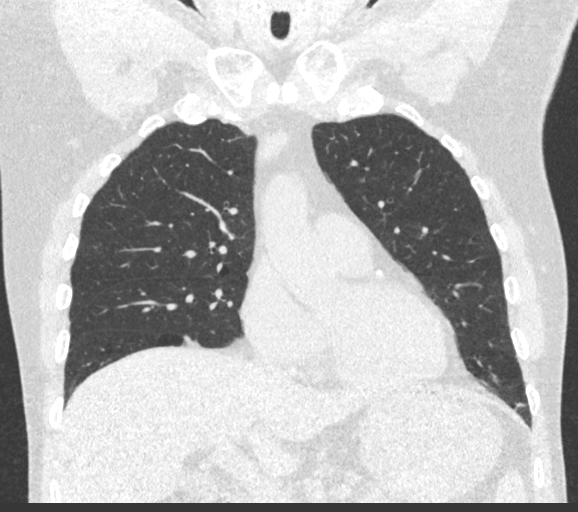
[im 179/299  lung]
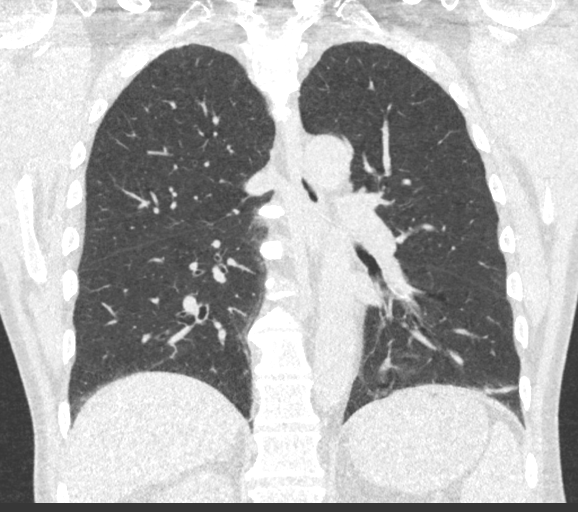

[15 of 36 positions shown; findings below may reference images not displayed]

FINDINGS: Cardiovascular: Heart size is normal. There is no significant
pericardial fluid, thickening or pericardial calcification. There is
aortic atherosclerosis, as well as atherosclerosis of the great
vessels of the mediastinum and the coronary arteries, including
calcified atherosclerotic plaque in the left main, left anterior
descending, left circumflex and right coronary arteries.

Mediastinum/Nodes: No pathologically enlarged mediastinal or hilar
lymph nodes. Please note that accurate exclusion of hilar adenopathy
is limited on noncontrast CT scans. Esophagus is unremarkable in
appearance. No axillary lymphadenopathy.

Lungs/Pleura: Small pulmonary nodule in the right upper lobe (axial
image 156 of series 3) measuring 2.9 mm in volume derived mean
diameter. No other larger more suspicious appearing pulmonary
nodules or masses are noted. No acute consolidative airspace
disease. No pleural effusions. Mild diffuse bronchial wall
thickening with very mild centrilobular and paraseptal emphysema.

Upper Abdomen: Unremarkable.

Musculoskeletal: There are no aggressive appearing lytic or blastic
lesions noted in the visualized portions of the skeleton.
IMPRESSION: 1. Lung-RADS 2S, benign appearance or behavior. Continue annual
screening with low-dose chest CT without contrast in 12 months.
2. The "S" modifier above refers to potentially clinically
significant non lung cancer related findings. Specifically, there is
aortic atherosclerosis, in addition to left main and 3 vessel
coronary artery disease. Please note that although the presence of
coronary artery calcium documents the presence of coronary artery
disease, the severity of this disease and any potential stenosis
cannot be assessed on this non-gated CT examination. Assessment for
potential risk factor modification, dietary therapy or pharmacologic
therapy may be warranted, if clinically indicated.
3. Mild diffuse bronchial wall thickening with very mild
centrilobular and paraseptal emphysema; imaging findings suggestive
of underlying COPD.

Aortic Atherosclerosis (QCOXX-5DI.I) and Emphysema (QCOXX-SPY.9).

## 2022-12-19 ENCOUNTER — Encounter: Payer: Self-pay | Admitting: *Deleted

## 2023-01-29 DIAGNOSIS — Z6825 Body mass index (BMI) 25.0-25.9, adult: Secondary | ICD-10-CM | POA: Diagnosis not present

## 2023-01-29 DIAGNOSIS — F17210 Nicotine dependence, cigarettes, uncomplicated: Secondary | ICD-10-CM | POA: Diagnosis not present

## 2023-01-29 DIAGNOSIS — R4582 Worries: Secondary | ICD-10-CM | POA: Diagnosis not present

## 2023-01-29 DIAGNOSIS — R030 Elevated blood-pressure reading, without diagnosis of hypertension: Secondary | ICD-10-CM | POA: Diagnosis not present

## 2023-01-29 DIAGNOSIS — F4000 Agoraphobia, unspecified: Secondary | ICD-10-CM | POA: Diagnosis not present

## 2023-01-29 DIAGNOSIS — R002 Palpitations: Secondary | ICD-10-CM | POA: Diagnosis not present

## 2023-01-29 DIAGNOSIS — F331 Major depressive disorder, recurrent, moderate: Secondary | ICD-10-CM | POA: Diagnosis not present

## 2023-02-11 ENCOUNTER — Telehealth: Payer: Self-pay | Admitting: *Deleted

## 2023-02-11 NOTE — Telephone Encounter (Signed)
  Procedure: Colonoscopy  Height: 6'4 Weight: 215lbs       Have you had a colonoscopy before?  01/13/18, Dr. Darrick Penna  Do you have family history of colon cancer?  no  Do you have a family history of polyps? no  Previous colonoscopy with polyps removed? yes  Do you have a history colorectal cancer?   no  Are you diabetic?  no  Do you have a prosthetic or mechanical heart valve? no  Do you have a pacemaker/defibrillator?   no  Have you had endocarditis/atrial fibrillation?  no  Do you use supplemental oxygen/CPAP?  no  Have you had joint replacement within the last 12 months?  no  Do you tend to be constipated or have to use laxatives?  no   Do you have history of alcohol use? If yes, how much and how often.  Yes stopped drinking 2019  Do you have history or are you using drugs? If yes, what do are you  using?  no  Have you ever had a stroke/heart attack?  no  Have you ever had a heart or other vascular stent placed,?no  Do you take weight loss medication? no  Do you take any blood-thinning medications such as: (Plavix, aspirin, Coumadin, Aggrenox, Brilinta, Xarelto, Eliquis, Pradaxa, Savaysa or Effient)? no  If yes we need the name, milligram, dosage and who is prescribing doctor:               Current Outpatient Medications  Medication Sig Dispense Refill   ALPRAZolam (XANAX) 0.5 MG tablet Take 0.5 mg by mouth 3 (three) times daily as needed for anxiety.     QUEtiapine (SEROQUEL) 50 MG tablet Take 50 mg by mouth at bedtime.     sertraline (ZOLOFT) 100 MG tablet Take 100 mg by mouth daily.     simvastatin (ZOCOR) 40 MG tablet Take 40 mg by mouth daily.     No current facility-administered medications for this visit.    Allergies  Allergen Reactions   Other Rash    Horse dander   Penicillins Other (See Comments)    Childhood allergy Has patient had a PCN reaction causing immediate rash, facial/tongue/throat swelling, SOB or lightheadedness with hypotension:  Unknown Has patient had a PCN reaction causing severe rash involving mucus membranes or skin necrosis: Unknown Has patient had a PCN reaction that required hospitalization: Unknown Has patient had a PCN reaction occurring within the last 10 years: No If all of the above answers are "NO", then may proceed with Cephalosporin use.    Fish Allergy Nausea And Vomiting

## 2023-03-02 NOTE — Telephone Encounter (Signed)
OK to schedule. ASA 2.  ?

## 2023-03-03 MED ORDER — PEG 3350-KCL-NA BICARB-NACL 420 G PO SOLR: 4000.0000 mL | Freq: Once | ORAL | 0 refills | Status: AC

## 2023-03-03 NOTE — Telephone Encounter (Signed)
Questionnaire from recall, no referral needed  

## 2023-03-03 NOTE — Addendum Note (Signed)
Addended by: Armstead Peaks on: 03/03/2023 03:50 PM   Modules accepted: Orders

## 2023-03-03 NOTE — Telephone Encounter (Signed)
SPOKE WITH PT. Scheduled for 5/30 with Dr. Marletta Lor. Aware will mail instructions rx for prep sent to pharmacy

## 2023-03-10 ENCOUNTER — Encounter: Payer: Self-pay | Admitting: *Deleted

## 2023-03-11 NOTE — Telephone Encounter (Signed)
Pt was scheduled for 04/25/23 for colonoscopy. He says that his insurance will change to medicare in July and would prefer to wait. Advised pt don't have providers schedule for July will call and reschedule once we get it.

## 2023-04-01 ENCOUNTER — Encounter (HOSPITAL_COMMUNITY): Payer: Self-pay

## 2023-04-01 ENCOUNTER — Other Ambulatory Visit (HOSPITAL_COMMUNITY): Payer: Self-pay

## 2023-04-15 ENCOUNTER — Encounter: Payer: Self-pay | Admitting: *Deleted

## 2023-04-15 NOTE — Telephone Encounter (Signed)
Pt has been rescheduled for 05/16/23. Pt informed to bring new insurance card to office so copy can be put in his chart. Updated instructions sent to pt

## 2023-04-15 NOTE — Telephone Encounter (Signed)
Pt left vm stating he was wanting to schedule his colonoscopy in July.  LMTRC

## 2023-04-25 ENCOUNTER — Ambulatory Visit (HOSPITAL_COMMUNITY): Admit: 2023-04-25 | Payer: Self-pay

## 2023-04-25 ENCOUNTER — Encounter (HOSPITAL_COMMUNITY): Payer: Self-pay

## 2023-04-25 SURGERY — COLONOSCOPY WITH PROPOFOL
Anesthesia: Monitor Anesthesia Care

## 2023-05-14 ENCOUNTER — Encounter (HOSPITAL_COMMUNITY)
Admission: RE | Admit: 2023-05-14 | Discharge: 2023-05-14 | Disposition: A | Discharge status: Home / Self Care | Payer: HMO | Source: Ambulatory Visit | Attending: Internal Medicine | Admitting: Internal Medicine

## 2023-05-14 ENCOUNTER — Encounter (HOSPITAL_COMMUNITY): Payer: Self-pay

## 2023-05-14 ENCOUNTER — Other Ambulatory Visit: Payer: Self-pay

## 2023-05-16 ENCOUNTER — Ambulatory Visit (HOSPITAL_COMMUNITY): Payer: HMO | Admitting: Anesthesiology

## 2023-05-16 ENCOUNTER — Encounter (HOSPITAL_COMMUNITY): Payer: Self-pay

## 2023-05-16 ENCOUNTER — Encounter (HOSPITAL_COMMUNITY)
Admission: RE | Disposition: A | Discharge status: Home / Self Care | Payer: Self-pay | Source: Ambulatory Visit | Attending: Internal Medicine

## 2023-05-16 ENCOUNTER — Ambulatory Visit (HOSPITAL_BASED_OUTPATIENT_CLINIC_OR_DEPARTMENT_OTHER): Payer: HMO | Admitting: Anesthesiology

## 2023-05-16 ENCOUNTER — Ambulatory Visit (HOSPITAL_COMMUNITY)
Admission: RE | Admit: 2023-05-16 | Discharge: 2023-05-16 | Disposition: A | Discharge status: Home / Self Care | Payer: HMO | Source: Ambulatory Visit | Attending: Internal Medicine | Admitting: Internal Medicine

## 2023-05-16 DIAGNOSIS — Z1211 Encounter for screening for malignant neoplasm of colon: Secondary | ICD-10-CM

## 2023-05-16 DIAGNOSIS — Z09 Encounter for follow-up examination after completed treatment for conditions other than malignant neoplasm: Secondary | ICD-10-CM | POA: Diagnosis not present

## 2023-05-16 DIAGNOSIS — K635 Polyp of colon: Secondary | ICD-10-CM | POA: Insufficient documentation

## 2023-05-16 DIAGNOSIS — F419 Anxiety disorder, unspecified: Secondary | ICD-10-CM | POA: Diagnosis not present

## 2023-05-16 DIAGNOSIS — Z87891 Personal history of nicotine dependence: Secondary | ICD-10-CM | POA: Insufficient documentation

## 2023-05-16 DIAGNOSIS — K648 Other hemorrhoids: Secondary | ICD-10-CM | POA: Diagnosis not present

## 2023-05-16 DIAGNOSIS — K6389 Other specified diseases of intestine: Secondary | ICD-10-CM | POA: Diagnosis not present

## 2023-05-16 DIAGNOSIS — F418 Other specified anxiety disorders: Secondary | ICD-10-CM | POA: Diagnosis not present

## 2023-05-16 DIAGNOSIS — F32A Depression, unspecified: Secondary | ICD-10-CM | POA: Diagnosis not present

## 2023-05-16 DIAGNOSIS — Z86010 Personal history of colonic polyps: Secondary | ICD-10-CM | POA: Diagnosis not present

## 2023-05-16 HISTORY — PX: COLONOSCOPY WITH PROPOFOL: SHX5780

## 2023-05-16 HISTORY — PX: POLYPECTOMY: SHX5525

## 2023-05-16 SURGERY — COLONOSCOPY WITH PROPOFOL
Anesthesia: General

## 2023-05-16 MED ORDER — LACTATED RINGERS IV SOLN: INTRAVENOUS | Status: DC

## 2023-05-16 MED ORDER — PROPOFOL 10 MG/ML IV BOLUS
INTRAVENOUS | Status: DC | PRN
Start: 2023-05-16 — End: 2023-05-16
  Administered 2023-05-16: 50 mg via INTRAVENOUS
  Administered 2023-05-16: 30 mg via INTRAVENOUS
  Administered 2023-05-16 (×2): 50 mg via INTRAVENOUS
  Administered 2023-05-16: 40 mg via INTRAVENOUS
  Administered 2023-05-16: 50 mg via INTRAVENOUS
  Administered 2023-05-16: 130 mg via INTRAVENOUS

## 2023-05-16 MED ORDER — LIDOCAINE HCL (CARDIAC) PF 100 MG/5ML IV SOSY
PREFILLED_SYRINGE | INTRAVENOUS | Status: DC | PRN
  Administered 2023-05-16: 50 mg via INTRAVENOUS

## 2023-05-16 NOTE — H&P (Signed)
Primary Care Physician:  Richardean Chimera, MD Primary Gastroenterologist:  Dr. Marletta Lor  Pre-Procedure History & Physical: HPI:  Zachary Adams is a 62 y.o. male is here for a colonoscopy to be performed for surveillance purposes, personal history of adenomatous colon polyps in 2019  Past Medical History:  Diagnosis Date   Adenomatous colon polyp 2008   Anxiety    Depression    Hyperlipemia    IBS (irritable bowel syndrome)     Past Surgical History:  Procedure Laterality Date   COLONOSCOPY  03/13/2007     SLF: A 5 mm sessile sigmoid adenomatous polyp removed    COLONOSCOPY N/A 12/15/2012   Dr. Darrick Penna: multiple sessile polyps (12), mild diverticulosis in sigmoid colon, moderate internal hemorrhoids. path with serrated adenoma and hyperplastic polyps.    COLONOSCOPY WITH PROPOFOL N/A 01/13/2018   Procedure: COLONOSCOPY WITH PROPOFOL;  Surgeon: West Bali, MD;  [facility];  Service: Endoscopy;  Laterality: N/A;  7:30am   ESOPHAGOGASTRODUODENOSCOPY  03/13/2007     WUJ:[id number] esophagus without evidence of erosion, Barrett's or ulcers/ Normal stomach, duodenal bulb and second portion of the duodenum   LEFT HEART CATH AND CORONARY ANGIOGRAPHY N/A 01/19/2021   Procedure: LEFT HEART CATH AND CORONARY ANGIOGRAPHY;  Surgeon: Lyn Records, MD;  Location: [facility];  Service: Cardiovascular;  Laterality: N/A;   POLYPECTOMY  01/13/2018   Procedure: POLYPECTOMY;  Surgeon: West Bali, MD;  [facility];  Service: Endoscopy;;  ascending colon polyp    Prior to Admission medications   Medication Sig Start Date End Date Taking? Authorizing Provider  QUEtiapine (SEROQUEL) 50 MG tablet Take 50 mg by mouth at bedtime.   Yes [provider]  sertraline (ZOLOFT) 100 MG tablet Take 100 mg by mouth daily.   Yes [provider]  simvastatin (ZOCOR) 40 MG tablet Take 40 mg by mouth at bedtime.   Yes [provider]  ALPRAZolam Prudy Feeler) 0.5  MG tablet Take 0.5 mg by mouth 3 (three) times daily as needed for anxiety. 11/09/12   [provider]    Allergies as of 04/15/2023 - Review Complete 02/02/2021  Allergen Reaction Noted   Other Rash 03/24/2018   Penicillins Other (See Comments) 12/03/2012   Fish allergy Nausea And Vomiting 03/24/2018    Family History  Problem Relation Age of Onset   Parkinsonism Father    Osteoporosis Mother    Colon cancer Other    Colon polyps Neg Hx     Social History   Socioeconomic History   Marital status: Divorced    Spouse name: Not on file   Number of children: 2   Years of education: Not on file   Highest education level: Not on file  Occupational History   Occupation: self employed, horse shoes  Tobacco Use   Smoking status: Former    Current packs/day: 0.00    Average packs/day: 2.0 packs/day for 30.0 years (60.0 ttl pk-yrs)    Types: Cigars, Cigarettes    Start date: 03/20/1987    Quit date: 03/19/2017    Years since quitting: 6.1   Smokeless tobacco: Never   Tobacco comments:    quit cigarettes 2000, quit cigar May 2018   Vaping Use   Vaping status: Never Used  Substance and Sexual Activity   Alcohol use: Not Currently    Comment: stopped 5 years ago   Drug use: No   Sexual activity: Not on file  Other Topics Concern  Not on file  Social History Narrative   Lives w/ youngest son.  Divorced.   Social Determinants of Health   Financial Resource Strain: Not on file  Food Insecurity: Not on file  Transportation Needs: Not on file  Physical Activity: Not on file  Stress: Not on file  Social Connections: Not on file  Intimate Partner Violence: Not on file    Review of Systems: See HPI, otherwise negative ROS  Physical Exam: Vital signs in last 24 hours: Temp:  [97.7 F (36.5 C)] 97.7 F (36.5 C) (07/12 0819) Pulse Rate:  [50] 50 (07/12 0819) Resp:  [18] 18 (07/12 0819) BP: (121)/(83) 121/83 (07/12 0819) SpO2:  [98 %] 98 % (07/12 0819) Weight:   [95.2 kg] 95.2 kg (07/12 0819)   General:   Alert,  Well-developed, well-nourished, pleasant and cooperative in NAD Head:  Normocephalic and atraumatic. Eyes:  Sclera clear, no icterus.   Conjunctiva pink. Ears:  Normal auditory acuity. Nose:  No deformity, discharge,  or lesions. Msk:  Symmetrical without gross deformities. Normal posture. Extremities:  Without clubbing or edema. Neurologic:  Alert and  oriented x4;  grossly normal neurologically. Skin:  Intact without significant lesions or rashes. Psych:  Alert and cooperative. Normal mood and affect.  Impression/Plan: Zachary Adams is here for a colonoscopy to be performed for surveillance purposes, personal history of adenomatous colon polyps in 2019  The risks of the procedure including infection, bleed, or perforation as well as benefits, limitations, alternatives and imponderables have been reviewed with the patient. Questions have been answered. All parties agreeable.

## 2023-05-16 NOTE — Anesthesia Postprocedure Evaluation (Signed)
Anesthesia Post Note  Patient: Zachary Adams  Procedure(s) Performed: COLONOSCOPY WITH PROPOFOL POLYPECTOMY  Patient location during evaluation: Phase II Anesthesia Type: General Level of consciousness: awake and alert Pain management: pain level controlled Vital Signs Assessment: post-procedure vital signs reviewed and stable Respiratory status: spontaneous breathing, nonlabored ventilation and respiratory function stable Cardiovascular status: blood pressure returned to baseline and stable Postop Assessment: no apparent nausea or vomiting Anesthetic complications: no  No notable events documented.   Last Vitals:  Vitals:   05/16/23 0819 05/16/23 0947  BP: 121/83 (!) 89/53  Pulse: (!) 50 (!) 59  Resp: 18 17  Temp: 36.5 C (!) 36.4 C  SpO2: 98% 99%    Last Pain:  Vitals:   05/16/23 0947  TempSrc:   PainSc: 0-No pain                 Marquavius Scaife C Havannah Streat

## 2023-05-16 NOTE — Anesthesia Preprocedure Evaluation (Signed)
Anesthesia Evaluation  Patient identified by MRN, date of birth, ID band Patient awake    Reviewed: Allergy & Precautions, H&P , NPO status , Patient's Chart, lab work & pertinent test results  Airway Mallampati: II  TM Distance: >3 FB Neck ROM: Full    Dental  (+) Dental Advisory Given, Teeth Intact   Pulmonary shortness of breath, former smoker   Pulmonary exam normal breath sounds clear to auscultation       Cardiovascular negative cardio ROS Normal cardiovascular exam Rhythm:Regular Rate:Normal     Neuro/Psych  PSYCHIATRIC DISORDERS Anxiety Depression    negative neurological ROS     GI/Hepatic negative GI ROS, Neg liver ROS,,,  Endo/Other  negative endocrine ROS    Renal/GU negative Renal ROS  negative genitourinary   Musculoskeletal negative musculoskeletal ROS (+)    Abdominal   Peds negative pediatric ROS (+)  Hematology negative hematology ROS (+)   Anesthesia Other Findings   Reproductive/Obstetrics negative OB ROS                              Anesthesia Physical Anesthesia Plan  ASA: 2  Anesthesia Plan: General   Post-op Pain Management: Minimal or no pain anticipated   Induction: Intravenous  PONV Risk Score and Plan: 1  Airway Management Planned: Nasal Cannula and Natural Airway  Additional Equipment:   Intra-op Plan:   Post-operative Plan:   Informed Consent: I have reviewed the patients History and Physical, chart, labs and discussed the procedure including the risks, benefits and alternatives for the proposed anesthesia with the patient or authorized representative who has indicated his/her understanding and acceptance.     Dental advisory given  Plan Discussed with: CRNA and Surgeon  Anesthesia Plan Comments:          Anesthesia Quick Evaluation

## 2023-05-16 NOTE — Transfer of Care (Signed)
Immediate Anesthesia Transfer of Care Note  Patient: Zachary Adams  Procedure(s) Performed: COLONOSCOPY WITH PROPOFOL POLYPECTOMY  Patient Location: PACU and Short Stay  Anesthesia Type:General  Level of Consciousness: drowsy  Airway & Oxygen Therapy: Patient Spontanous Breathing  Post-op Assessment: Report given to RN and Post -op Vital signs reviewed and stable  Post vital signs: Reviewed and stable  Last Vitals:  Vitals Value Taken Time  BP    Temp    Pulse    Resp    SpO2      Last Pain:  Vitals:   05/16/23 0915  TempSrc:   PainSc: 0-No pain         Complications: No notable events documented.

## 2023-05-16 NOTE — Anesthesia Procedure Notes (Signed)
Date/Time: 05/16/2023 9:20 AM  Performed by: Julian Reil, CRNAPre-anesthesia Checklist: Patient identified, Emergency Drugs available, Suction available and Patient being monitored Patient Re-evaluated:Patient Re-evaluated prior to induction Oxygen Delivery Method: Nasal cannula Induction Type: IV induction Placement Confirmation: positive ETCO2

## 2023-05-16 NOTE — Op Note (Signed)
[facility] Patient Name: Zachary Adams Procedure Date: 05/16/2023 9:10 AM MRN: [id number] Date of Birth: 1960-12-10 Attending MD: Hennie Duos. Marletta Lor , [place], [id number] CSN: [id number] Age: 62 Admit Type: Outpatient Procedure:                Colonoscopy Indications:              Surveillance: Personal history of adenomatous                            polyps on last colonoscopy 5 years ago Providers:                Hennie Duos. Marletta Lor, DO, Sheran Fava, Angelica Ran, Zena Amos Referring MD:              Medicines:                See the Anesthesia note for documentation of the                            administered medications Complications:            No immediate complications. Estimated Blood Loss:     Estimated blood loss was minimal. Procedure:                Pre-Anesthesia Assessment:                           - The anesthesia plan was to use monitored                            anesthesia care (MAC).                           After obtaining informed consent, the colonoscope                            was passed under direct vision. Throughout the                            procedure, the patient's blood pressure, pulse, and                            oxygen saturations were monitored continuously. The                            [id number]-[id number] ([id number]) was introduced through the                            anus and advanced to the the cecum, identified by                            appendiceal orifice and ileocecal valve. The                            colonoscopy was performed without difficulty.  The                            patient tolerated the procedure well. The quality                            of the bowel preparation was evaluated using the                            BBPS [facility] Bowel Preparation Scale) with scores                            of: Right Colon = 3, Transverse Colon = 3 and Left                            Colon = 3  (entire mucosa seen well with no residual                            staining, small fragments of stool or opaque                            liquid). The total BBPS score equals 9. Scope In: 9:19:12 AM Scope Out: 9:38:19 AM Scope Withdrawal Time: 0 hours 12 minutes 55 seconds  Total Procedure Duration: 0 hours 19 minutes 7 seconds  Findings:      Non-bleeding internal hemorrhoids were found during endoscopy.      Three sessile polyps were found in the sigmoid colon and descending       colon. The polyps were 4 to 7 mm in size. These polyps were removed with       a cold snare. Resection and retrieval were complete.      The exam was otherwise without abnormality. Impression:               - Non-bleeding internal hemorrhoids.                           - Three 4 to 7 mm polyps in the sigmoid colon and                            in the descending colon, removed with a cold snare.                            Resected and retrieved.                           - The examination was otherwise normal. Moderate Sedation:      Per Anesthesia Care Recommendation:           - Patient has a contact number available for                            emergencies. The signs and symptoms of potential                            delayed complications were discussed  with the                            patient. Return to normal activities tomorrow.                            Written discharge instructions were provided to the                            patient.                           - Resume previous diet.                           - Continue present medications.                           - Await pathology results.                           - Repeat colonoscopy in 5 years for surveillance.                           - Return to GI clinic PRN. Procedure Code(s):        --- Professional ---                           [phone], Colonoscopy, flexible; with removal of                            tumor(s), polyp(s),  or other lesion(s) by snare                            technique Diagnosis Code(s):        --- Professional ---                           Z86.010, Personal history of colonic polyps                           D12.5, Benign neoplasm of sigmoid colon                           D12.4, Benign neoplasm of descending colon                           K64.8, Other hemorrhoids CPT copyright 2022 [employer]. All rights reserved. The codes documented in this report are preliminary and upon coder review may  be revised to meet current compliance requirements. Hennie Duos. Marletta Lor, DO Hennie Duos. Marletta Lor, DO 05/16/2023 9:42:26 AM This report has been signed electronically. Number of Addenda: 0

## 2023-05-16 NOTE — Discharge Instructions (Signed)
°  Colonoscopy °Discharge Instructions ° °Read the instructions outlined below and refer to this sheet in the next few weeks. These discharge instructions provide you with general information on caring for yourself after you leave the hospital. Your doctor may also give you specific instructions. While your treatment has been planned according to the most current medical practices available, unavoidable complications occasionally occur.  ° °ACTIVITY °You may resume your regular activity, but move at a slower pace for the next 24 hours.  °Take frequent rest periods for the next 24 hours.  °Walking will help get rid of the air and reduce the bloated feeling in your belly (abdomen).  °No driving for 24 hours (because of the medicine (anesthesia) used during the test).   °Do not sign any important legal documents or operate any machinery for 24 hours (because of the anesthesia used during the test).  °NUTRITION °Drink plenty of fluids.  °You may resume your normal diet as instructed by your doctor.  °Begin with a light meal and progress to your normal diet. Heavy or fried foods are harder to digest and may make you feel sick to your stomach (nauseated).  °Avoid alcoholic beverages for 24 hours or as instructed.  °MEDICATIONS °You may resume your normal medications unless your doctor tells you otherwise.  °WHAT YOU CAN EXPECT TODAY °Some feelings of bloating in the abdomen.  °Passage of more gas than usual.  °Spotting of blood in your stool or on the toilet paper.  °IF YOU HAD POLYPS REMOVED DURING THE COLONOSCOPY: °No aspirin products for 7 days or as instructed.  °No alcohol for 7 days or as instructed.  °Eat a soft diet for the next 24 hours.  °FINDING OUT THE RESULTS OF YOUR TEST °Not all test results are available during your visit. If your test results are not back during the visit, make an appointment with your caregiver to find out the results. Do not assume everything is normal if you have not heard from your  caregiver or the medical facility. It is important for you to follow up on all of your test results.  °SEEK IMMEDIATE MEDICAL ATTENTION IF: °You have more than a spotting of blood in your stool.  °Your belly is swollen (abdominal distention).  °You are nauseated or vomiting.  °You have a temperature over 101.  °You have abdominal pain or discomfort that is severe or gets worse throughout the day.  ° °Your colonoscopy revealed 3 polyp(s) which I removed successfully. Await pathology results, my office will contact you. I recommend repeating colonoscopy in 5 years for surveillance purposes. Otherwise follow up with GI as needed.  ° ° °I hope you have a great rest of your week! ° °Mohammad Granade K. Demarco Bacci, D.O. °Gastroenterology and Hepatology °[facility] ° °

## 2023-05-19 LAB — SURGICAL PATHOLOGY

## 2023-05-21 ENCOUNTER — Encounter (HOSPITAL_COMMUNITY): Payer: Self-pay | Admitting: Internal Medicine

## 2023-06-05 DIAGNOSIS — Z6825 Body mass index (BMI) 25.0-25.9, adult: Secondary | ICD-10-CM | POA: Diagnosis not present

## 2023-06-05 DIAGNOSIS — F331 Major depressive disorder, recurrent, moderate: Secondary | ICD-10-CM | POA: Diagnosis not present

## 2023-06-05 DIAGNOSIS — R4582 Worries: Secondary | ICD-10-CM | POA: Diagnosis not present

## 2023-06-05 DIAGNOSIS — F17210 Nicotine dependence, cigarettes, uncomplicated: Secondary | ICD-10-CM | POA: Diagnosis not present

## 2023-06-05 DIAGNOSIS — R002 Palpitations: Secondary | ICD-10-CM | POA: Diagnosis not present

## 2023-06-05 DIAGNOSIS — F4000 Agoraphobia, unspecified: Secondary | ICD-10-CM | POA: Diagnosis not present

## 2023-06-05 DIAGNOSIS — R030 Elevated blood-pressure reading, without diagnosis of hypertension: Secondary | ICD-10-CM | POA: Diagnosis not present

## 2023-09-22 DIAGNOSIS — Z125 Encounter for screening for malignant neoplasm of prostate: Secondary | ICD-10-CM | POA: Diagnosis not present

## 2023-09-22 DIAGNOSIS — Z0001 Encounter for general adult medical examination with abnormal findings: Secondary | ICD-10-CM | POA: Diagnosis not present

## 2023-09-22 DIAGNOSIS — Z1321 Encounter for screening for nutritional disorder: Secondary | ICD-10-CM | POA: Diagnosis not present

## 2023-09-22 DIAGNOSIS — E7849 Other hyperlipidemia: Secondary | ICD-10-CM | POA: Diagnosis not present

## 2023-09-22 DIAGNOSIS — Z720 Tobacco use: Secondary | ICD-10-CM | POA: Diagnosis not present

## 2023-09-22 DIAGNOSIS — K7689 Other specified diseases of liver: Secondary | ICD-10-CM | POA: Diagnosis not present

## 2023-09-22 DIAGNOSIS — F17210 Nicotine dependence, cigarettes, uncomplicated: Secondary | ICD-10-CM | POA: Diagnosis not present

## 2023-09-22 DIAGNOSIS — Z1329 Encounter for screening for other suspected endocrine disorder: Secondary | ICD-10-CM | POA: Diagnosis not present

## 2023-09-22 DIAGNOSIS — I10 Essential (primary) hypertension: Secondary | ICD-10-CM | POA: Diagnosis not present

## 2023-09-22 DIAGNOSIS — R7301 Impaired fasting glucose: Secondary | ICD-10-CM | POA: Diagnosis not present

## 2023-09-29 DIAGNOSIS — D696 Thrombocytopenia, unspecified: Secondary | ICD-10-CM | POA: Diagnosis not present

## 2023-09-29 DIAGNOSIS — F17210 Nicotine dependence, cigarettes, uncomplicated: Secondary | ICD-10-CM | POA: Diagnosis not present

## 2023-09-29 DIAGNOSIS — F331 Major depressive disorder, recurrent, moderate: Secondary | ICD-10-CM | POA: Diagnosis not present

## 2023-09-29 DIAGNOSIS — R030 Elevated blood-pressure reading, without diagnosis of hypertension: Secondary | ICD-10-CM | POA: Diagnosis not present

## 2023-09-29 DIAGNOSIS — F4000 Agoraphobia, unspecified: Secondary | ICD-10-CM | POA: Diagnosis not present

## 2023-09-29 DIAGNOSIS — Z6826 Body mass index (BMI) 26.0-26.9, adult: Secondary | ICD-10-CM | POA: Diagnosis not present

## 2023-09-29 DIAGNOSIS — Z0001 Encounter for general adult medical examination with abnormal findings: Secondary | ICD-10-CM | POA: Diagnosis not present

## 2023-09-29 DIAGNOSIS — R002 Palpitations: Secondary | ICD-10-CM | POA: Diagnosis not present

## 2023-10-24 DIAGNOSIS — D696 Thrombocytopenia, unspecified: Secondary | ICD-10-CM | POA: Diagnosis not present

## 2023-10-31 DIAGNOSIS — I10 Essential (primary) hypertension: Secondary | ICD-10-CM | POA: Diagnosis not present

## 2023-10-31 DIAGNOSIS — Z0001 Encounter for general adult medical examination with abnormal findings: Secondary | ICD-10-CM | POA: Diagnosis not present

## 2023-10-31 DIAGNOSIS — E7849 Other hyperlipidemia: Secondary | ICD-10-CM | POA: Diagnosis not present

## 2023-10-31 DIAGNOSIS — F17210 Nicotine dependence, cigarettes, uncomplicated: Secondary | ICD-10-CM | POA: Diagnosis not present

## 2024-03-19 DIAGNOSIS — E7849 Other hyperlipidemia: Secondary | ICD-10-CM | POA: Diagnosis not present

## 2024-03-19 DIAGNOSIS — K7689 Other specified diseases of liver: Secondary | ICD-10-CM | POA: Diagnosis not present

## 2024-03-19 DIAGNOSIS — E782 Mixed hyperlipidemia: Secondary | ICD-10-CM | POA: Diagnosis not present

## 2024-03-26 DIAGNOSIS — D1803 Hemangioma of intra-abdominal structures: Secondary | ICD-10-CM | POA: Diagnosis not present

## 2024-03-26 DIAGNOSIS — F4001 Agoraphobia with panic disorder: Secondary | ICD-10-CM | POA: Diagnosis not present

## 2024-03-26 DIAGNOSIS — F331 Major depressive disorder, recurrent, moderate: Secondary | ICD-10-CM | POA: Diagnosis not present

## 2024-03-26 DIAGNOSIS — E782 Mixed hyperlipidemia: Secondary | ICD-10-CM | POA: Diagnosis not present

## 2024-03-26 DIAGNOSIS — Z6827 Body mass index (BMI) 27.0-27.9, adult: Secondary | ICD-10-CM | POA: Diagnosis not present

## 2024-03-26 DIAGNOSIS — E7849 Other hyperlipidemia: Secondary | ICD-10-CM | POA: Diagnosis not present

## 2024-09-14 DIAGNOSIS — E7849 Other hyperlipidemia: Secondary | ICD-10-CM | POA: Diagnosis not present

## 2024-09-14 DIAGNOSIS — K7689 Other specified diseases of liver: Secondary | ICD-10-CM | POA: Diagnosis not present

## 2024-09-14 DIAGNOSIS — D559 Anemia due to enzyme disorder, unspecified: Secondary | ICD-10-CM | POA: Diagnosis not present

## 2024-09-14 DIAGNOSIS — R7301 Impaired fasting glucose: Secondary | ICD-10-CM | POA: Diagnosis not present

## 2024-09-14 DIAGNOSIS — Z125 Encounter for screening for malignant neoplasm of prostate: Secondary | ICD-10-CM | POA: Diagnosis not present

## 2024-09-14 DIAGNOSIS — Z1329 Encounter for screening for other suspected endocrine disorder: Secondary | ICD-10-CM | POA: Diagnosis not present

## 2024-09-14 DIAGNOSIS — Z0001 Encounter for general adult medical examination with abnormal findings: Secondary | ICD-10-CM | POA: Diagnosis not present

## 2024-09-14 DIAGNOSIS — Z1321 Encounter for screening for nutritional disorder: Secondary | ICD-10-CM | POA: Diagnosis not present

## 2024-09-14 DIAGNOSIS — I10 Essential (primary) hypertension: Secondary | ICD-10-CM | POA: Diagnosis not present

## 2024-09-21 ENCOUNTER — Other Ambulatory Visit (HOSPITAL_COMMUNITY): Payer: Self-pay | Admitting: Family Medicine

## 2024-09-21 DIAGNOSIS — Z0001 Encounter for general adult medical examination with abnormal findings: Secondary | ICD-10-CM | POA: Diagnosis not present

## 2024-09-21 DIAGNOSIS — F17210 Nicotine dependence, cigarettes, uncomplicated: Secondary | ICD-10-CM

## 2024-09-21 DIAGNOSIS — D1803 Hemangioma of intra-abdominal structures: Secondary | ICD-10-CM | POA: Diagnosis not present

## 2024-09-21 DIAGNOSIS — F4001 Agoraphobia with panic disorder: Secondary | ICD-10-CM | POA: Diagnosis not present

## 2024-09-21 DIAGNOSIS — Z6827 Body mass index (BMI) 27.0-27.9, adult: Secondary | ICD-10-CM | POA: Diagnosis not present

## 2024-09-21 DIAGNOSIS — F331 Major depressive disorder, recurrent, moderate: Secondary | ICD-10-CM | POA: Diagnosis not present

## 2024-10-25 ENCOUNTER — Ambulatory Visit (HOSPITAL_COMMUNITY)

## 2024-10-27 ENCOUNTER — Ambulatory Visit (HOSPITAL_COMMUNITY)
Admission: RE | Admit: 2024-10-27 | Discharge: 2024-10-27 | Disposition: A | Discharge status: Home / Self Care | Source: Ambulatory Visit | Attending: Family Medicine | Admitting: Family Medicine

## 2024-10-27 DIAGNOSIS — F17210 Nicotine dependence, cigarettes, uncomplicated: Secondary | ICD-10-CM | POA: Insufficient documentation
